# Patient Record
Sex: Female | Born: 1950 | Race: White | Hispanic: No | Marital: Single | Smoking: Never smoker
Health system: Southern US, Community
[De-identification: ages and names within clinical notes are randomized; demographics above are authoritative.]

## PROBLEM LIST (undated history)

## (undated) DIAGNOSIS — I639 Cerebral infarction, unspecified: Secondary | ICD-10-CM

## (undated) DIAGNOSIS — I10 Essential (primary) hypertension: Secondary | ICD-10-CM

## (undated) DIAGNOSIS — M545 Low back pain, unspecified: Secondary | ICD-10-CM

## (undated) DIAGNOSIS — I739 Peripheral vascular disease, unspecified: Secondary | ICD-10-CM

## (undated) DIAGNOSIS — K219 Gastro-esophageal reflux disease without esophagitis: Secondary | ICD-10-CM

## (undated) DIAGNOSIS — M1990 Unspecified osteoarthritis, unspecified site: Secondary | ICD-10-CM

## (undated) DIAGNOSIS — F419 Anxiety disorder, unspecified: Secondary | ICD-10-CM

## (undated) DIAGNOSIS — Z87442 Personal history of urinary calculi: Secondary | ICD-10-CM

## (undated) DIAGNOSIS — R112 Nausea with vomiting, unspecified: Secondary | ICD-10-CM

## (undated) DIAGNOSIS — J189 Pneumonia, unspecified organism: Secondary | ICD-10-CM

## (undated) DIAGNOSIS — Z9889 Other specified postprocedural states: Secondary | ICD-10-CM

## (undated) DIAGNOSIS — R51 Headache: Secondary | ICD-10-CM

## (undated) DIAGNOSIS — F32A Depression, unspecified: Secondary | ICD-10-CM

## (undated) DIAGNOSIS — F329 Major depressive disorder, single episode, unspecified: Secondary | ICD-10-CM

## (undated) DIAGNOSIS — R519 Headache, unspecified: Secondary | ICD-10-CM

## (undated) HISTORY — PX: BREAST SURGERY: SHX581

## (undated) HISTORY — PX: COLONOSCOPY: SHX174

## (undated) HISTORY — PX: ABDOMINAL HYSTERECTOMY: SHX81

## (undated) HISTORY — PX: OTHER SURGICAL HISTORY: SHX169

## (undated) HISTORY — PX: BACK SURGERY: SHX140

---

## 1997-07-09 DIAGNOSIS — I639 Cerebral infarction, unspecified: Secondary | ICD-10-CM

## 1997-07-09 HISTORY — DX: Cerebral infarction, unspecified: I63.9

## 1997-07-09 HISTORY — PX: SHOULDER ARTHROSCOPY: SHX128

## 2001-07-09 HISTORY — PX: SHOULDER ARTHROSCOPY: SHX128

## 2007-08-01 ENCOUNTER — Observation Stay (HOSPITAL_COMMUNITY): Admission: RE | Admit: 2007-08-01 | Discharge: 2007-08-02 | Payer: Self-pay | Admitting: Neurosurgery

## 2007-09-04 ENCOUNTER — Encounter: Admission: RE | Admit: 2007-09-04 | Discharge: 2007-09-04 | Payer: Self-pay | Admitting: Neurosurgery

## 2007-12-09 ENCOUNTER — Encounter: Admission: RE | Admit: 2007-12-09 | Discharge: 2007-12-09 | Payer: Self-pay | Admitting: Neurosurgery

## 2008-01-02 ENCOUNTER — Encounter: Admission: RE | Admit: 2008-01-02 | Discharge: 2008-01-02 | Payer: Self-pay | Admitting: Neurosurgery

## 2008-01-27 ENCOUNTER — Encounter: Admission: RE | Admit: 2008-01-27 | Discharge: 2008-01-27 | Payer: Self-pay | Admitting: Neurosurgery

## 2008-03-05 ENCOUNTER — Ambulatory Visit: Payer: Self-pay | Admitting: Pulmonary Disease

## 2008-03-05 ENCOUNTER — Inpatient Hospital Stay (HOSPITAL_COMMUNITY): Admission: RE | Admit: 2008-03-05 | Discharge: 2008-03-12 | Payer: Self-pay | Admitting: Neurosurgery

## 2008-05-11 ENCOUNTER — Encounter: Admission: RE | Admit: 2008-05-11 | Discharge: 2008-05-11 | Payer: Self-pay | Admitting: Neurosurgery

## 2008-05-18 ENCOUNTER — Encounter: Admission: RE | Admit: 2008-05-18 | Discharge: 2008-05-18 | Payer: Self-pay | Admitting: Neurosurgery

## 2008-11-30 ENCOUNTER — Encounter: Admission: RE | Admit: 2008-11-30 | Discharge: 2008-11-30 | Payer: Self-pay | Admitting: Neurosurgery

## 2009-01-21 ENCOUNTER — Encounter: Admission: RE | Admit: 2009-01-21 | Discharge: 2009-01-21 | Payer: Self-pay | Admitting: Neurosurgery

## 2009-07-25 IMAGING — CR DG CHEST 1V PORT
1 series · 1 of 1 positions shown · non-contrast
Comparison: 03/07/2008.

CLINICAL DATA: Post lumbar fusion.  Follow-up atelectasis.

PORTABLE CHEST - 1 VIEW

[view not recorded]
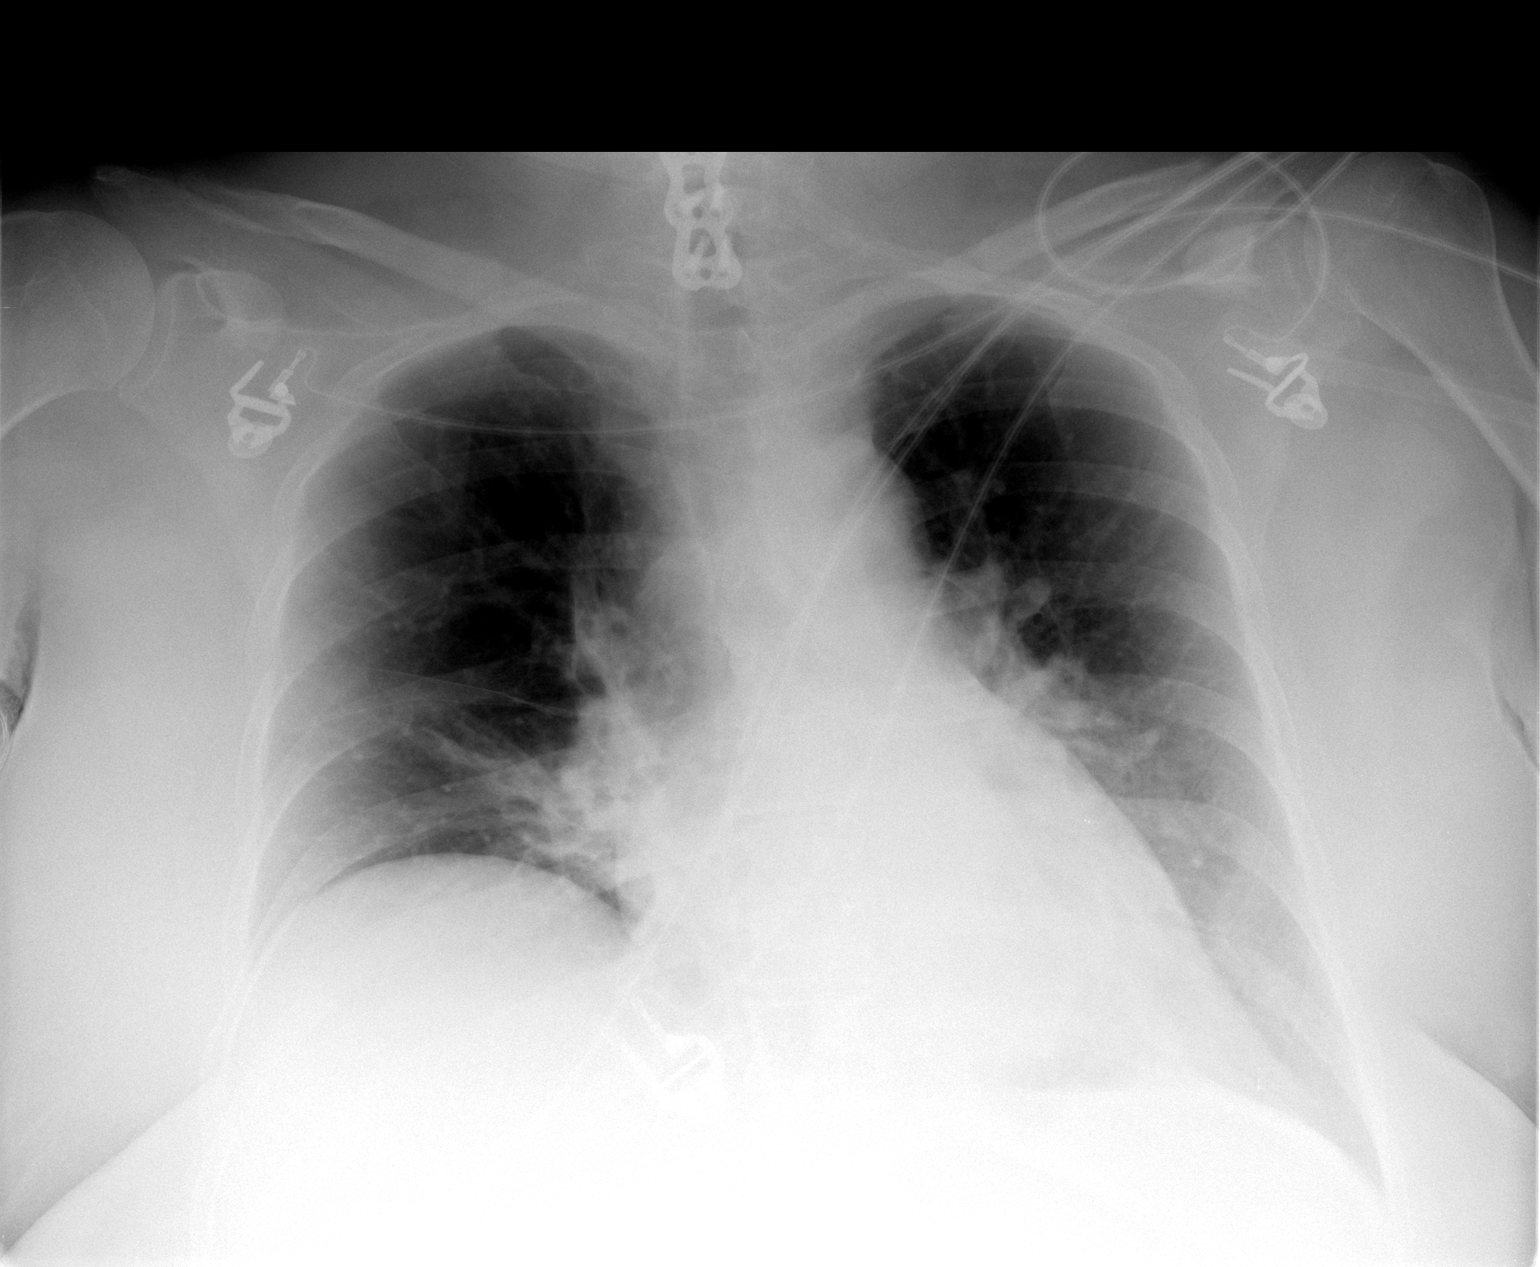

[1 of 1 positions shown; findings below may reference images not displayed]

FINDINGS: There are stable right basilar atelectatic changes.
There is also mild left basilar atelectasis present.  There is no
pneumothorax.  The heart is borderline in size and stable in
appearance.  There are no edematous changes.  No evidence for
pneumothorax.
IMPRESSION: Stable right basilar atelectasis.  Mild left basilar atelectasis
noted on today's study.

## 2009-09-26 ENCOUNTER — Encounter: Admission: RE | Admit: 2009-09-26 | Discharge: 2009-09-26 | Payer: Self-pay | Admitting: Neurosurgery

## 2009-11-24 ENCOUNTER — Encounter: Admission: RE | Admit: 2009-11-24 | Discharge: 2009-11-24 | Payer: Self-pay | Admitting: Neurosurgery

## 2009-12-26 ENCOUNTER — Ambulatory Visit (HOSPITAL_COMMUNITY): Admission: RE | Admit: 2009-12-26 | Discharge: 2009-12-27 | Payer: Self-pay | Admitting: Neurosurgery

## 2010-05-22 ENCOUNTER — Encounter: Admission: RE | Admit: 2010-05-22 | Discharge: 2010-05-22 | Payer: Self-pay | Admitting: Sports Medicine

## 2010-07-30 ENCOUNTER — Encounter: Payer: Self-pay | Admitting: Neurosurgery

## 2010-07-31 ENCOUNTER — Encounter: Payer: Self-pay | Admitting: Orthopedic Surgery

## 2010-09-24 LAB — CBC
Hemoglobin: 13.7 g/dL (ref 12.0–15.0)
MCHC: 33.7 g/dL (ref 30.0–36.0)
MCV: 89.2 fL (ref 78.0–100.0)
RBC: 4.55 MIL/uL (ref 3.87–5.11)
RDW: 13.9 % (ref 11.5–15.5)

## 2010-09-24 LAB — BASIC METABOLIC PANEL
CO2: 27 mEq/L (ref 19–32)
Chloride: 108 mEq/L (ref 96–112)
Creatinine, Ser: 0.93 mg/dL (ref 0.4–1.2)
GFR calc Af Amer: >60 mL/min (ref >60)
Glucose, Bld: 97 mg/dL (ref 70–99)
Sodium: 141 mEq/L (ref 135–145)

## 2010-10-25 ENCOUNTER — Other Ambulatory Visit (HOSPITAL_COMMUNITY): Payer: Self-pay

## 2010-10-26 ENCOUNTER — Encounter (HOSPITAL_COMMUNITY)
Admission: RE | Admit: 2010-10-26 | Discharge: 2010-10-26 | Disposition: A | Discharge status: Home / Self Care | Payer: Medicare Other | Source: Ambulatory Visit | Attending: Neurosurgery | Admitting: Neurosurgery

## 2010-10-26 LAB — TYPE AND SCREEN: ABO/RH(D): B POS

## 2010-10-26 LAB — BASIC METABOLIC PANEL
BUN: 21 mg/dL (ref 6–23)
CO2: 24 mEq/L (ref 19–32)
Calcium: 9.1 mg/dL (ref 8.4–10.5)
Creatinine, Ser: 1.17 mg/dL (ref 0.4–1.2)
GFR calc non Af Amer: 48 mL/min — ABNORMAL LOW (ref >60)
Glucose, Bld: 100 mg/dL — ABNORMAL HIGH (ref 70–99)

## 2010-10-26 LAB — CBC
HCT: 42.4 % (ref 36.0–46.0)
Hemoglobin: 14.2 g/dL (ref 12.0–15.0)
MCH: 29.3 pg (ref 26.0–34.0)
MCHC: 33.5 g/dL (ref 30.0–36.0)
RDW: 13.8 % (ref 11.5–15.5)

## 2010-11-01 ENCOUNTER — Inpatient Hospital Stay (HOSPITAL_COMMUNITY): Payer: Medicare Other

## 2010-11-01 ENCOUNTER — Inpatient Hospital Stay (HOSPITAL_COMMUNITY)
Admission: RE | Admit: 2010-11-01 | Discharge: 2010-11-04 | DRG: 460 | Disposition: A | Discharge status: Home / Self Care | Payer: Medicare Other | Source: Ambulatory Visit | Attending: Neurosurgery | Admitting: Neurosurgery

## 2010-11-01 DIAGNOSIS — M5126 Other intervertebral disc displacement, lumbar region: Secondary | ICD-10-CM | POA: Diagnosis present

## 2010-11-01 DIAGNOSIS — Z01812 Encounter for preprocedural laboratory examination: Secondary | ICD-10-CM

## 2010-11-01 DIAGNOSIS — M5137 Other intervertebral disc degeneration, lumbosacral region: Principal | ICD-10-CM | POA: Diagnosis present

## 2010-11-01 DIAGNOSIS — M51379 Other intervertebral disc degeneration, lumbosacral region without mention of lumbar back pain or lower extremity pain: Principal | ICD-10-CM | POA: Diagnosis present

## 2010-11-03 LAB — BASIC METABOLIC PANEL
CO2: 31 mEq/L (ref 19–32)
Calcium: 7.7 mg/dL — ABNORMAL LOW (ref 8.4–10.5)
Chloride: 100 mEq/L (ref 96–112)
GFR calc Af Amer: 54 mL/min — ABNORMAL LOW (ref >60)
Potassium: 4.3 mEq/L (ref 3.5–5.1)
Sodium: 135 mEq/L (ref 135–145)

## 2010-11-05 NOTE — Discharge Summary (Signed)
  NAMEWILNA, Sierra Leach                 ACCOUNT NO.:  [id number]  MEDICAL RECORD NO.:  [id number]           PATIENT TYPE:  I  LOCATION:  3011                         FACILITY:  [facility]  PHYSICIAN:  Cristi Loron, M.D.DATE OF BIRTH:  [birth year]  DATE OF ADMISSION:  11/01/2010 DATE OF DISCHARGE:  11/04/2010                              DISCHARGE SUMMARY   BRIEF HISTORY:  The patient is a 60 year old female who has longstanding back pain and underwent an L3, L5-S1 fusion many years ago, underwent hardware removal 2 years ago, and has had progressive worsening back and bilateral leg pain over less than 2-3 months.  Radiating down to the front of her quad and inner part of her thigh.  Followup imaging showed progressive degenerative breakdown at both L1-2 and L2-3 with severe spinal stenosis.  Recess stenosis at L2-3 consistent with her L2 and L3 radiculopathy.  She had a previous fusion and due to failing conservative management, we recommended PLIF at L2-3 with posterolateral fusion at L1-3.  I went over to risks and benefits of the operation with her and she understands and agreed to proceed forward.  For further details of this admission, please refer to typed history and physical.  HOSPITAL COURSE:  Dr. Wynetta Emery admitted the patient to [facility] on November 01, 2010.  On the day of admission, we performed an L1-L3 decompression and fusion.  Surgery went well (for full details of this operation, please refer to typed operative note).  POSTOPERATIVE COURSE:  The patient's postop course was unremarkable. She was discharged home on postop #3.  DISCHARGE INSTRUCTIONS:  The patient was given written discharge instructions.  Instructed to follow up with Dr. Wynetta Emery.  FINAL DIAGNOSES:  Degenerative disease at L1-2 and L2-3 with a ruptured disk L2-3, with severe lumbar spinal stenosis.  PROCEDURE PERFORMED:  Lumbar decompressive laminectomy and posterior lumbar interbody fusion at  L2-3 with a hybrid Telamon PEEK cage and tangent allograft wedge with locally harvested autograft bone, mixed with Actifuse and BMP, and pedicle screw fixation at L1-L3 using globus Revere pedicle screw system, posterolateral arthrodesis L1-L3 with local harvested autograft bone and Actifuse and BMP and placement of large Hemovac drain.     Cristi Loron, M.D.    JDJ/MEDQ  D:  11/04/2010  T:  11/04/2010  Job:  [id number]  Electronically Signed by Tressie Stalker M.D. on 11/05/2010 11:08:46 AM

## 2010-11-09 NOTE — Op Note (Signed)
Sierra Leach, Sierra Leach                 ACCOUNT NO.:  [id number]  MEDICAL RECORD NO.:  [id number]           PATIENT TYPE:  I  LOCATION:  3011                         FACILITY:  [facility]  PHYSICIAN:  Donalee Citrin, M.D.        DATE OF BIRTH:  1951-05-15  DATE OF PROCEDURE:  11/01/2010 DATE OF DISCHARGE:                              OPERATIVE REPORT   PREOPERATIVE DIAGNOSES:  Degenerative disease L1-2 and L2-3 with ruptured disk L2-3 and severe lumbar spinal stenosis.  PROCEDURE:  Decompressive lumbar laminectomy and posterior lumbar interbody fusion L2-3 using a hybrid Telamon PEEK cage and TANGENT allograft wedge with locally harvested autograft mixed with Actifuse and BMP and pedicle screw fixation L1-L3 using the Globus REVERE pedicle screw system, posterolateral arthrodesis L1-L3 using locally harvested autograft mixed with Actifuse and BMP, and placement of large Hemovac drain.  SURGEON:  Donalee Citrin, M.D.  Threasa HeadsYetta Barre.  ANESTHESIA:  General endotracheal.  HISTORY OF PRESENT ILLNESS:  The patient is a 60 year old female who has had longstanding back pain, underwent L3-S1 fusion many years ago, underwent hardware removal 2 years ago and has had progressive worsening back and bilateral leg pain over the last 2-3 months, radiating down to front of her quad and inner part of her thigh.  Followup imaging showed progressive degeneration breakdown at both L1-2 and L2-3 with severe spinal stenosis, lateral recess stenosis at L2-3 consistent with her L2 and L3 radiculopathy.  This was above a previous fusion and due to the failing conservative treatment, we recommended a PLIF at L2-3 with posterolateral fusion L1-L3.  I went over the risks and benefits of the operation with her, she understood and agreed to proceed forward.  DESCRIPTION OF PROCEDURE:  The patient was brought to the OR, was induced under general anesthesia, and placed prone on the Wilson frame. Back was prepped and  draped in usual sterile fashion.  Old incision was partially open at the superior aspect and extended superiorly. Subperiosteal dissection carried through the scar exposing the T-piece at L1, L2, and L3 respectively.  Intraoperative x-ray identify the appropriate level, so the spinous process at L2 was removed.  Complete central decompression was begun and complete medial facetectomies were performed with dissecting through the scar tissue and dissecting out the L3 nerve free of the pedicle at L3.  Aggressive foraminotomies were performed at L2 and after adequate central decompression has been achieved, which was remarkable for marked facet arthropathy and severe hourglass compression of thecal sac, attention taken to pedicle screw placement.  Pilot holes were drilled at L1 and L2, cannulated with the awl probe, tapped with a 5/5 tap, probed again and six 5/40 screws inserted at L1, six 5/45 at L2 and the old pedicle holes at L3 were also identified and six 5/45 were inserted there.  After all six screws were placed, attention was taken to interbody work.  Disk spaces were cleaned out bilaterally.  First an 8 distractor was inserted but this felt to be too small, so a 10 distractor subsequently placed on the other side. Disk space was then cleaned out with a  size 10 rotating cutter and downgoing curettes.  Then, a, Telamon PEEK cage packed with locally harvested autograft mixed with Actifuse and placed on the patient's right side.  Left side distractor was removed.  Fluoroscopy was used up along the way to confirm depth and trajectory.  Disk space was cleaned out in similar fashion on the left.  BMP was packed anteriorly, autograft was packed centrally and the left side TANGENT was inserted. All the foramina were reinspected and confirmed patency.  Then, wound was copiously irrigated.  Meticulous hemostasis was maintained. Aggressive decortication was carried at T-piece and lateral  gutters. BMP was packed posterolaterally with autograft on top of it and then 65- mm rod was placed on one side, 75 on the other.  Top-tightening nuts down the L2 screws compressed against L3 and the L1 tightened down as well.  After all pedicle screws had been tightened down, fluoroscopy and cross-link were applied.  Postop fluoroscopy confirmed good position of everything.  The drain was placed and the wound was closed in layers with Vicryl and running 4-0 subcuticular.  Benzoin and Steri-Strips were applied.  The patient went to the recovery room in stable condition.  At the end of the case, sponge, needle, and instrument counts were correct.          ______________________________ Donalee Citrin, M.D.     GC/MEDQ  D:  11/01/2010  T:  11/02/2010  Job:  [id number]  Electronically Signed by Donalee Citrin M.D. on 11/09/2010 11:19:02 AM

## 2010-11-21 NOTE — Op Note (Signed)
NAMETELISA, OHLSEN NO.:  [id number]   MEDICAL RECORD NO.:  [id number]          PATIENT TYPE:  INP   LOCATION:  3105                         FACILITY:  [facility]   PHYSICIAN:  Donalee Citrin, M.D.        DATE OF BIRTH:  06/11/1951   DATE OF PROCEDURE:  03/05/2008  DATE OF DISCHARGE:                               OPERATIVE REPORT   PREOPERATIVE DIAGNOSES:  1. Degenerative disk disease with ruptured disk, L3-4.  2. Degenerative disk disease with spondylosis, L4-5.  3. Degenerative disk disease with ruptured disk, L5-S1, with      diskogenic mechanical low back pain and lumbar spinal stenosis.  4. 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   PROCEDURES:  1. Redo decompressive lumbar laminectomy, L5-S1.  2. Redo decompressive lumbar laminectomy, L4-5.  3. Decompressive lumbar laminectomy, L3-4.  4. Posterior lumbar interbody fusion, L3-4, L4-5, using a hybrid      Telamon on one side with Tangent on the other.  5. Transforaminal lumbar interbody fusion, L5-S1, using the Capstone      PEEK system packed with local autograft mixed with Actifuse.  6. Pedicle screw fixation, L3 to S1, using 6.35 Legacy pedicle screw      system.  7. Posterolateral arthrodesis, L3 to S1, using local autograft mixed      with Actifuse bone substitute.  8. Placement of 2 medium Hemovac drains.   SURGEON:  Donalee Citrin, MD   ASSISTANT:  Reinaldo Meeker, MD   ANESTHESIA:  General endotracheal.   HISTORY OF PRESENT ILLNESS:  The patient is a very pleasant 60 year old  female who has had longstanding back pain, had 2 previous laminectomies,  gotten progressively worse, refractory to all forms of conservative  treatment.  Pain was predominantly in her back but secondarily in her  legs in both the left leg down the bottom of foot as well as in the  right leg upper thigh.  Her MRI scan showed a disk herniation at L3-4 as  well as a recurrent disk herniation at L5-S1 and severe  degenerative  disk disease and scar tissue at L3-4, L4-5, and L5-S1.  So, the patient  after failing all forms of conservative treatment was recommended redo  decompressive laminectomies and fusion from L3 to S1.  Risk and benefits  of the operation were explained to the patient.  She understood and  agreed to proceed forward.   DESCRIPTION OF PROCEDURE:  The patient was brought to the OR, was  induced under general anesthesia, and placed prone on the Wilson frame.  Back was prepped in usual sterile fashion.  Her old incision was opened  up and extended cephalocaudally.  The scar tissue was dissected free,  and subperiosteal dissection was carried out at the lamina of L3, L4,  L5, and S1.  The scar tissue was extensively dissected off the residual  laminotomies from the old surgery at L5-S1 on the left and L4-5 on the  right.  All TPs were exposed.  Intraoperative x-ray confirmed  localization at the appropriate level, and the spinous processes at L3,  L4, and L5 were removed.  Complete central decompression was performed,  and then marching along the lateral gutters complete medial  facetectomies were performed working around the scar tissue, first  working at L5-S1.  There was an extensive amount of scar on the left  side L5-S1 and a very large recurrent calcified disk.  As the dura was  being teased off the calcified disk, a small annular rent was  appreciated with a small amount of spinal fluid coming out of this; this  was packed away.  The large calcified recurrent disk was bitten away  with combination of osteophyte removers and pituitary rongeurs.  After  these adequate foraminotomies had been performed, I was able to work  around this calcified disk at L5 and S1, the right side.  Complete  medial facetectomies were performed, and that disk was markedly  degenerated and collapsed.  Then marching up to L4-5, there was a severe  amount of scar tissue around the right side causing  severe stenosis in  the proximal L5 root.  This was all freed up and removed.  Extensive  facet arthropathy at both these levels also causing hourglass  compression of the thecal sac here too.  This was all teased away and  removed with complete medial facetectomies performed there as well,  decompressing both the L4 and L5 root.  Then, working up at L3-4,  complete medial facetectomies were performed.  There was marked facet  arthropathy displacing the L3 root superiorly as well as forward  inferiorly.  This was all cleaned away and removed.  After the  decompression had been completed, attention was taken to pedicle screw  placement.  Using a high-speed drill, pilot holes were drilled at L3 on  the right and cannulated with the awl.  Using bony landmarks both within  the canal and external to the canal and using fluoroscopy, this was  cannulated, probed, tapped with a 5.5 tap, probed again, and 6 x 50  screw inserted at L3 on the right.  This procedure was repeated at L4  with 6 x 45, at L5 with 6 x 40, and S1 with a 6 x 35.  On the left side,  all 4 screws were placed in similar fashion.  Fluoroscopy at each step  along the way confirmed good depth and trajectory, and the pedicles were  probed from within the pedicle as well as using bony landmarks from  within the canal and external to the canal confirming no mediolateral  breach.  After all 8 screws were in place, attention was taken to the  interbody.  First working at L3-4, the epidural veins were coagulated  and there was a very marked amount of bleeding during the case,  predominantly from the epidural veins but also from the bone.  This was  waxed away as best we could.  After the disk space was cleaned out with  pituitary rongeurs, a size 10 distractor was inserted.  This had good  apposition of the endplates and felt to be appropriate sizing for the  interbody spacers, so working on the opposite side disk space was  cleaned  out with a size 10 cutter and chisel.  Then, a 10 x 22 mm  Telamon PEEK cage packed with local harvested autograft mixed with  Actifuse bone substitute was placed.  Then, the distractor was removed.  The opposite side was cleaned out and reamed.  Central endplates were  scraped.  Autograft was packed centrally, and  the opposite-side Tangent  was inserted.  After L3-4 was complete, attention was taken to L4-5.  The scar tissue again was dissected off  the laminotomy defect on the  right at this side freeing up the disk space as well as removing a large  herniation from underneath the L4 root at the inferior aspect of L3-4  disk space as well as at the level of L4-5 disk space.  After all this  had been performed, an 8 distractor was inserted.  This had good  apposition of the endplates and felt to be appropriate size.  So, in a  similar fashion endplates were prepped on both sides, using fluoroscopy  each step along the way to confirm depth and trajectory, scraped and  chiseled.  Telamon 8 x 22 packed with local autograft mixed with  Actifuse was inserted on one side with a Tangent on the other with  autograft packed between.  Then, again working at L5-S1, because of the  extensive calcified recurrent disk herniation, it was very difficult to  gain access to the disk space on the left side L5-S1, so a distractor  was inserted on the right.  Then, working through this calcified disk,  the thecal sac was decompressed.  The S1 and L5 nerve roots were  decompressed.  Attempts were made to ream this out.  However, the defect  in the disk space looked like it violated the endplates at L5, so  instead of placing a graft it was decided to convert this level over to  a TLIF, and a TLIF was performed using the Capstone PEEK system, so the  right-sided distractor was removed.  This was prepped for TLIF, and the  TLIF bone was packed with local autograft mixed with Actifuse and  inserted from the  patient's right side across the midline.  Fluoroscopy  confirmed good depth and trajectory.  This was inspected from both sides  and felt to be in good position.  Then, after all this was performed,  aggressive irrigation and aggressive decortication with T-piece and  lateral gutters were performed.  Remainder of the autograft was packed  in the lateral gutters.  Rods were inserted.  Palpation was done  at S1;  the L5 pedicle screw was compressed against S1 much more dramatically on  the right side than the left, where there was defect, and then on the L4-  5 it was compressed and L3-4 it was compressed.  A 420 cross-link was  then inserted.  After all of this had been placed, the foramina were  reinspected, and all the nerve roots seemed to be widely decompressed.  Gelfoam and Surgifoam were used to maintain hemostasis.  Two drains were  then placed, and after fluoroscopy confirmed good position of the  screws, rods, and bone grafts the wound was closed in layers with  interrupted Vicryl over the drains, and prior to that Tisseel  was placed over that dural defect and then packed with Gelfoam.  The  skin was closed with running 4-0 subcuticular.  Benzoin and Steri-Strips  were applied.  The patient went to recovery room in stable condition.  At the end of the case, sponge, needle, and instrument counts were  correct.           ______________________________  Donalee Citrin, M.D.     GC/MEDQ  D:  03/05/2008  T:  03/06/2008  Job:  [id number]

## 2010-11-21 NOTE — Op Note (Signed)
NAMENICKOLA, LENIG                 ACCOUNT NO.:  [id number]   MEDICAL RECORD NO.:  [id number]          PATIENT TYPE:  INP   LOCATION:  3172                         FACILITY:  [facility]   PHYSICIAN:  Donalee Citrin, M.D.        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   PROCEDURE:  Anterior cervical diskectomies and fusion at C4-5, C5-6 and  C6-7 using 7 mm allografts at C4-5 and C6-7, a 6 mm at C5-6, and a 60 mm  Venture plate with eight 32 mm screws.   SURGEON:  Donalee Citrin, MD.   Threasa HeadsYetta Barre.   ANESTHESIA:  General endotracheal.   HPI:  The patient is a very pleasant 60 year old female who has had  progressively worsening neck and bilateral arm pain ringing down her  left arm into her left hand as well as into the right shoulder with  numbness in her right hand.  The patient had weakness in the triceps  preoperatively and patient failed local and conservative treatment.  An  MRI scan showed severe spondylosis with stenosis, foraminal stenosis C4-  5, C5-6 and C6-7.  The patient was recommended cervical diskectomy and  fusion.  The risks and benefits of the operation were explained to the  patient, she understood and agreed.   PROCEDURE REPORT:  The patient was brought to the OR, was induced under  general anesthesia, placed supine with the neck in slight extension  __________ traction.  The right side of the neck was prepped and draped  in the usual sterile fashion.  Preop anesthesia localized the C5-6 disk  space.  A curvilinear incision was made __________ and divided  longitudinally.  The avascular plane __________ strap muscles was  developed down into the prevertebral fascia.  The prevertebral fascia  __________ .  The C4-5 disk space was  identified by fluoroscopy  __________  disk space__________ was reflected laterally at that level  and the two levels below.  A large osteophyte and spur was bitten off  the C5-6 disk space and then both interspaces were entered.  With  __________ Kerrison punch large anterior osteophytes were bitten off the  C4, C5 and C6 vertebral bodies respectively.  Then __________ curets  were used to scrape the disk and endplates and then high speed was  drilled on all three disk spaces at the posterior __________ osteophytic  complexes.  At this point __________ first at C6-7.  The interspace was  drilled down further to the posterior annulus, posterior osteophyte and  using a 1 mm Kerrison punch the osteophytes coming off of the C6 and C7  vertebral bodies were under bitten, decompressing the central canal.  __________ posterior longitudinal ligament in piecemeal fashion.  Marching across the patient's left side, a fairly sizeable bone spur  coming off the C6 vertebral body was removed, decompressing the central  canal and the C7 nerve root on the left was identified and decompressed  flush to the pedicle.  Then marching up to the right a larger spur was  coming off the uncinate process, this was all removed, decompressing the  right C7 nerve root.  Then Gelfoam was __________ space.  Attention was  taken to C5-6.  This disk was noted to be __________ degenerated, it was  drilled down.  A very large osteophyte came off the C5 vertebral body,  it was under bitten, this was mostly leftward and removal of this  decompressed the central canal and both C6 nerve roots identified  __________ pedicle.  Aggressive under biting of both endplates were  achieved at all levels and this decompressed the central canal as well.  Then working at C4-5 in a similar fashion the disk space was drilled  down, decompressing under bitten both C4 and C5 vertebral bodies,  flushed with the C5 pedicle.  The right C5 nerve  root was remarkably  stenotic.  This was all decompressed and easily accepted a nerve hook at  the end of the foraminotomy.  Once adequate decompression had been  achieved at all levels and all foramina, 7 mm grafts were inserted 1 mm  deep to anterior vertebral body __________ both the C4-5 and C6-7 as  well as C5-6.  Then a 60 mm Venture plate was placed.  Meticulous  hemostasis was maintained with Surgifoam and irrigation and  electrocautery.  All 8 screws were inserted and all screws had excellent  purchase and locking mechanism was engaged.  Fluoroscopy confirmed good  position of the plates, screws and bone grafts and then the wound was  copiously irrigated again with continuous hemostasis maintained,  __________ was reapproximated with interrupted Vicryl and the skin was  closed with a running #4-0 subcuticular __________ went to the recovery  room in stable condition.           ______________________________  Donalee Citrin, M.D.     GC/MEDQ  D:  08/01/2007  T:  08/01/2007  Job:  [id number]

## 2010-11-21 NOTE — Consult Note (Signed)
Sierra Leach, FLOOR NO.:  [id number]   MEDICAL RECORD NO.:  [id number]          PATIENT TYPE:  INP   LOCATION:  3105                         FACILITY:  [facility]   PHYSICIAN:  Maree Krabbe, M.D.DATE OF BIRTH:  November 04, 1950   DATE OF CONSULTATION:  03/07/2008  DATE OF DISCHARGE:                                 CONSULTATION   REASON FOR CONSULT:  Acute renal insufficiency and elevated creatinine.   HISTORY:  This is a 57-year white female with a history of obesity,  hypertension, DJD with a history of cervical spine surgery in the past,  as well as chronic low back pain, who was admitted on March 05, 2008,  for elective lumbar spine surgery.  She had extensive surgery with  significant blood loss and postoperative hypotension with blood  pressures in the 80s-90s.  She had a creatinine of 1.27 on admission  which has been up in the mid 2s over the last 36 hours and her urine  output has been down around 30 mL an hour yesterday.  Blood pressure is  lower today.  After some IV fluid boluses and increased IV fluid rates,  the blood pressure is now up in the 110s and urine output has improved  up to 70 mL an hour.  The patient has no complaints.  She is fully  alert.  She denies any history of kidney disease.  She does have a prior  history of kidney stones.  She denies any shortness of breath, chest  pain, nausea, vomiting currently, abdominal pain, or diarrhea.  She has  a Foley catheter in place.  She denies any myalgia, arthralgia, or focal  numbness or weakness.   PAST MEDICAL HISTORY:  As above.   MEDICATIONS AT HOME:  She takes Coreg, Celexa, and a pain pill.  She  also said that she takes a generic of Diovan, but this is not on her med  list on her H&P.   CURRENT MEDICATIONS:  In the hospital are IV vancomycin, Colace, and  p.r.n.'s.   PAST SURGICAL HISTORY:  Hysterectomy and cervical spine surgery.   REVIEW OF SYSTEMS:  As above.   PHYSICAL  EXAMINATION:  VITAL SIGNS:  Blood pressure is 116/70, heart  rate 80, and respirations are 16.  GENERAL:  The patient is alert and oriented white female, significantly  obese.  No distress.  SKIN:  Without rash.  She does have 1+ nonpitting diffuse edema in arms  and legs.  She has some telangiectases about on her face and chin.  CHEST:  Clear throughout.  CARDIAC:  Regular rate and rhythm without rub or gallops.  She does have  2/6 systolic ejection murmur which is transmitted to both carotids.  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  ABDOMEN:  Obese and nontender.  Suprapubic scar well-healed from  previous hysterectomy.  EXTREMITIES:  No femoral bruits.  No cyanosis.  Good pulses in the feet.  NEUROLOGICAL:  Alert and oriented x3.   LABORATORY DATA:  Sodium 138, potassium 4.2, BUN 27, creatinine 2.4,  hemoglobin 8.8, magnesium 1.4, and calcium 6.8.   IMPRESSION:  1. Acute renal failure due to postoperative hypotension and subsequent      renal hypoperfusion.  Currently, blood pressure has improved      significantly and urine output is improving also with restoration      of blood pressure.  I expect that she will start to recover      function and I would keep her blood pressure over 110 systolic if      possible, in the short term.  Also recommend transfusing 1 more      unit of packed red blood cells for colloid support.  2. Status post lumbar surgery in March 05, 2008.  3. Anemia due to acute blood loss.  4. History of previous C-spine surgery.  5. History of hypertension, on Coreg and also possibly generic Diovan      at home.  6. Depression, on Celexa.   PLAN:  As above.      Maree Krabbe, M.D.  Electronically Signed     RDS/MEDQ  D:  03/07/2008  T:  03/08/2008  Job:  [id number]

## 2010-11-24 NOTE — Discharge Summary (Signed)
NAMESHARADA, ALBORNOZ NO.:  [id number]   MEDICAL RECORD NO.:  [id number]          PATIENT TYPE:  INP   LOCATION:  3011                         FACILITY:  [facility]   PHYSICIAN:  Donalee Citrin, M.D.        DATE OF BIRTH:  24-Aug-1950   DATE OF ADMISSION:  03/05/2008  DATE OF DISCHARGE:  03/12/2008                               DISCHARGE SUMMARY   ADMITTING DIAGNOSES:  1. Degenerative disk disease.  2. Lumbar spinal stenosis of L3-4, L4-5, L5-S1.   PROCEDURE:  On this admission was:  Decompressive lumbar laminectomies  and posterior lumbar interbody fusion, L3-4, L4-5, L5-S1.   HOSPITAL COURSE:  The patient is a 60 year old female who was admitted  to the [facility] and went to the operating room, underwent the aforementioned  procedure.  Postoperatively, the patient did very well, the patient went  to the recovery room and then to the ICU.  Postoperatively in the ICU,  the patient was noted to be hypotensive with low urine output.  Laboratory values showed a bump in her creatinine, so the patient was  put to the ICU.  Critical Care Medicine was consulted for managing her  oliguric renal failure.  The patient was watched very carefully in the  ICU due to persistent hypertension secondary to probable hypervolemia.  Creatinine was 3.4 on postoperative day #1.  Postoperative hemoglobin  was 9.9 with a hematocrit of 29.5.  The patient neurologically was doing  well.  She had good strength in her extremities with no focal motor  deficits.  Hemovac was maintained in the first couple of postoperative  days with output exceeding 30 mL in shift.  Renal failure was felt to be  secondary to ATN due to __________ hypertension in the perioperative  period.  This was maintained with persistent fluid and increase in  fluid.  Renal consult was obtained on second postoperative day, and they  recommended additional transfusions, additional increase in her volume  status, changed over some of her  medications as well as ordered renal  ultrasound and urinalysis.  The patient continued to be observed in the  ICU.  The patient continued to make some slow and steady progress and  creatinine slowly started to come down.  Hemoglobin and hematocrit  remained stable and did come up with transfusion.  She was progressively  mobilizing with physical and occupational therapy and subsequently will  be transferred to the floor.  On the floor, the patient continued to  progress and make steady improvement.  Volume status continued to  improve, and her mobilization status continued to improve, and by  hospital day #7, the patient was stable neurologically and from a  Nephrology standpoint, labs were stable and creatinine was slowly coming  down.  The patient was able to be discharged home with a scheduled  followup in approximately 1 week in Neurosurgery as well as follow up  with Nephrology.  The patient was sent home with p.o. pain medication.  She was voiding spontaneously.           ______________________________  Donalee Citrin,  M.D.     GC/MEDQ  D:  04/15/2008  T:  04/16/2008  Job:  [id number]

## 2010-12-07 ENCOUNTER — Ambulatory Visit
Admission: RE | Admit: 2010-12-07 | Discharge: 2010-12-07 | Disposition: A | Discharge status: Home / Self Care | Payer: Medicare Other | Source: Ambulatory Visit | Attending: Neurosurgery | Admitting: Neurosurgery

## 2010-12-07 ENCOUNTER — Other Ambulatory Visit: Payer: Self-pay | Admitting: Neurosurgery

## 2010-12-07 DIAGNOSIS — M545 Low back pain: Secondary | ICD-10-CM

## 2010-12-08 NOTE — Discharge Summary (Signed)
  NAMEMARIELIS, Sierra Leach NO.:  [id number]  MEDICAL RECORD NO.:  [id number]          PATIENT TYPE:  LOCATION:                                 FACILITY:  PHYSICIAN:  Donalee Citrin, M.D.        DATE OF BIRTH:  09-Jul-1951  DATE OF ADMISSION:  12/26/2009 DATE OF DISCHARGE:  12/27/2009                              DISCHARGE SUMMARY   ADMITTING DIAGNOSIS:  Painful hardware.  DISCHARGE DIAGNOSIS:  Painful hardware.  PROCEDURE:  Exploration of fusion, removal of hardware.  HISTORY OF PRESENT ILLNESS:  The patient is a very pleasant 60 year old female who underwent previous 3:1 fusion, initially did very well, however, over the last several weeks to months has had progressive worsening back pain localized around the hardware.  Full workup with CT scan, MRI scan, and plain films have showed fusion and complete exhaustive treatments for managing her pain which had been incomplete, so there is a chance that her overwork may be contributing to her pain, so she was requested and was recommended hardware removal and exploration of fusion.  We went over the risks and benefits of the operation with her and she understands and agreed to proceed forward.  The patient was brought in the hospital and underwent the aforementioned procedure.  Postoperatively, the patient went to the recovery room and then to the floor and on the floor on December 27, 2009, the patient was doing very well postoperatively with significant improvement in her back pain.  Her wound was clean and dry.  Her Hemovac was discontinued and she was ready to be discharged home with scheduled followup in approximately 2 weeks.          ______________________________ Donalee Citrin, M.D.     GC/MEDQ  D:  11/09/2010  T:  11/09/2010  Job:  [id number]  Electronically Signed by Donalee Citrin M.D. on 12/08/2010 06:39:26 AM

## 2011-03-26 ENCOUNTER — Other Ambulatory Visit: Payer: Self-pay | Admitting: Neurosurgery

## 2011-03-26 DIAGNOSIS — M79606 Pain in leg, unspecified: Secondary | ICD-10-CM

## 2011-03-26 DIAGNOSIS — M549 Dorsalgia, unspecified: Secondary | ICD-10-CM

## 2011-03-27 ENCOUNTER — Ambulatory Visit
Admission: RE | Admit: 2011-03-27 | Discharge: 2011-03-27 | Disposition: A | Discharge status: Home / Self Care | Payer: Medicare Other | Source: Ambulatory Visit | Attending: Neurosurgery | Admitting: Neurosurgery

## 2011-03-27 ENCOUNTER — Other Ambulatory Visit: Payer: Self-pay | Admitting: Neurosurgery

## 2011-03-27 VITALS — BP 191/108 | HR 62

## 2011-03-27 DIAGNOSIS — M5416 Radiculopathy, lumbar region: Secondary | ICD-10-CM

## 2011-03-27 DIAGNOSIS — M549 Dorsalgia, unspecified: Secondary | ICD-10-CM

## 2011-03-27 MED ORDER — METHYLPREDNISOLONE ACETATE 40 MG/ML INJ SUSP (RADIOLOG
120.0000 mg | Freq: Once | INTRAMUSCULAR | Status: AC
  Administered 2011-03-27: 120 mg via EPIDURAL

## 2011-03-27 MED ORDER — IOHEXOL 180 MG/ML  SOLN
1.0000 mL | Freq: Once | INTRAMUSCULAR | Status: AC | PRN
  Administered 2011-03-27: 1 mL via EPIDURAL

## 2011-03-29 LAB — BASIC METABOLIC PANEL
Chloride: 104
GFR calc non Af Amer: >60
Glucose, Bld: 101 — ABNORMAL HIGH
Potassium: 4
Sodium: 141

## 2011-03-29 LAB — CBC
HCT: 41.2
Hemoglobin: 13.9
MCV: 89.5
RDW: 12.4

## 2011-04-11 LAB — URINALYSIS, ROUTINE W REFLEX MICROSCOPIC
Bilirubin Urine: NEGATIVE
Glucose, UA: NEGATIVE
Hgb urine dipstick: NEGATIVE
Ketones, ur: NEGATIVE
Protein, ur: NEGATIVE
Urobilinogen, UA: 0.2

## 2011-04-11 LAB — CBC
HCT: 24.7 — ABNORMAL LOW
HCT: 25.4 — ABNORMAL LOW
Hemoglobin: 8.7 — ABNORMAL LOW
MCHC: 33.5
MCHC: 34.1
MCV: 89.7
Platelets: 116 — ABNORMAL LOW
Platelets: 152
RDW: 14.4
WBC: 6

## 2011-04-11 LAB — RENAL FUNCTION PANEL
Albumin: 1.9 — ABNORMAL LOW
Albumin: 1.9 — ABNORMAL LOW
BUN: 18
BUN: 19
CO2: 26
Calcium: 8 — ABNORMAL LOW
Chloride: 107
Chloride: 108
Creatinine, Ser: 1.93 — ABNORMAL HIGH
GFR calc Af Amer: 33 — ABNORMAL LOW
GFR calc non Af Amer: 27 — ABNORMAL LOW
Glucose, Bld: 111 — ABNORMAL HIGH
Glucose, Bld: 115 — ABNORMAL HIGH
Phosphorus: 3
Phosphorus: 3.5
Potassium: 3.2 — ABNORMAL LOW
Potassium: 3.2 — ABNORMAL LOW
Potassium: 4.1
Sodium: 139

## 2011-04-11 LAB — VANCOMYCIN, TROUGH: Vancomycin Tr: 23 — ABNORMAL HIGH

## 2011-04-11 LAB — HEMOGLOBIN AND HEMATOCRIT, BLOOD
HCT: 24.4 — ABNORMAL LOW
Hemoglobin: 8.3 — ABNORMAL LOW

## 2011-04-11 LAB — URINE MICROSCOPIC-ADD ON

## 2011-07-10 DIAGNOSIS — I739 Peripheral vascular disease, unspecified: Secondary | ICD-10-CM

## 2011-07-10 HISTORY — DX: Peripheral vascular disease, unspecified: I73.9

## 2011-08-20 ENCOUNTER — Other Ambulatory Visit: Payer: Self-pay | Admitting: Neurosurgery

## 2011-08-20 DIAGNOSIS — M545 Low back pain: Secondary | ICD-10-CM

## 2011-08-21 ENCOUNTER — Ambulatory Visit
Admission: RE | Admit: 2011-08-21 | Discharge: 2011-08-21 | Disposition: A | Discharge status: Home / Self Care | Payer: Medicare Other | Source: Ambulatory Visit | Attending: Neurosurgery | Admitting: Neurosurgery

## 2011-08-21 ENCOUNTER — Other Ambulatory Visit: Payer: Self-pay | Admitting: Neurosurgery

## 2011-08-21 DIAGNOSIS — M5126 Other intervertebral disc displacement, lumbar region: Secondary | ICD-10-CM

## 2011-08-21 DIAGNOSIS — M545 Low back pain: Secondary | ICD-10-CM

## 2011-12-27 ENCOUNTER — Other Ambulatory Visit (HOSPITAL_COMMUNITY): Payer: Self-pay | Admitting: Neurosurgery

## 2011-12-27 DIAGNOSIS — M48061 Spinal stenosis, lumbar region without neurogenic claudication: Secondary | ICD-10-CM

## 2011-12-27 DIAGNOSIS — M549 Dorsalgia, unspecified: Secondary | ICD-10-CM

## 2012-01-02 ENCOUNTER — Encounter (HOSPITAL_COMMUNITY)
Admission: RE | Admit: 2012-01-02 | Discharge: 2012-01-02 | Disposition: A | Discharge status: Home / Self Care | Payer: Medicare Other | Source: Ambulatory Visit | Attending: Neurosurgery | Admitting: Neurosurgery

## 2012-01-02 ENCOUNTER — Encounter (HOSPITAL_COMMUNITY): Payer: Self-pay

## 2012-01-02 DIAGNOSIS — M48061 Spinal stenosis, lumbar region without neurogenic claudication: Secondary | ICD-10-CM | POA: Insufficient documentation

## 2012-01-02 DIAGNOSIS — M549 Dorsalgia, unspecified: Secondary | ICD-10-CM | POA: Insufficient documentation

## 2012-01-02 HISTORY — DX: Low back pain, unspecified: M54.50

## 2012-01-02 HISTORY — DX: Low back pain: M54.5

## 2012-01-02 MED ORDER — TECHNETIUM TC 99M MEDRONATE IV KIT
23.8000 | PACK | Freq: Once | INTRAVENOUS | Status: AC | PRN
  Administered 2012-01-02: 23.8 via INTRAVENOUS

## 2012-01-25 ENCOUNTER — Other Ambulatory Visit: Payer: Self-pay | Admitting: Sports Medicine

## 2012-01-25 DIAGNOSIS — M545 Low back pain: Secondary | ICD-10-CM

## 2012-01-30 ENCOUNTER — Ambulatory Visit
Admission: RE | Admit: 2012-01-30 | Discharge: 2012-01-30 | Disposition: A | Discharge status: Home / Self Care | Payer: Medicare Other | Source: Ambulatory Visit | Attending: Sports Medicine | Admitting: Sports Medicine

## 2012-01-30 VITALS — BP 176/105 | HR 67

## 2012-01-30 DIAGNOSIS — M5126 Other intervertebral disc displacement, lumbar region: Secondary | ICD-10-CM

## 2012-01-30 DIAGNOSIS — M545 Low back pain: Secondary | ICD-10-CM

## 2012-01-30 MED ORDER — METHYLPREDNISOLONE ACETATE 40 MG/ML INJ SUSP (RADIOLOG
120.0000 mg | Freq: Once | INTRAMUSCULAR | Status: AC
  Administered 2012-01-30: 120 mg via EPIDURAL

## 2012-01-30 MED ORDER — IOHEXOL 180 MG/ML  SOLN
1.0000 mL | Freq: Once | INTRAMUSCULAR | Status: AC | PRN
  Administered 2012-01-30: 1 mL via EPIDURAL

## 2012-03-18 IMAGING — CR DG CHEST 1V PORT
1 series · 1 of 1 positions shown · non-contrast
Comparison: Chest 12/23/2009.

CLINICAL DATA: Line placement.

PORTABLE CHEST - 1 VIEW

[view not recorded]
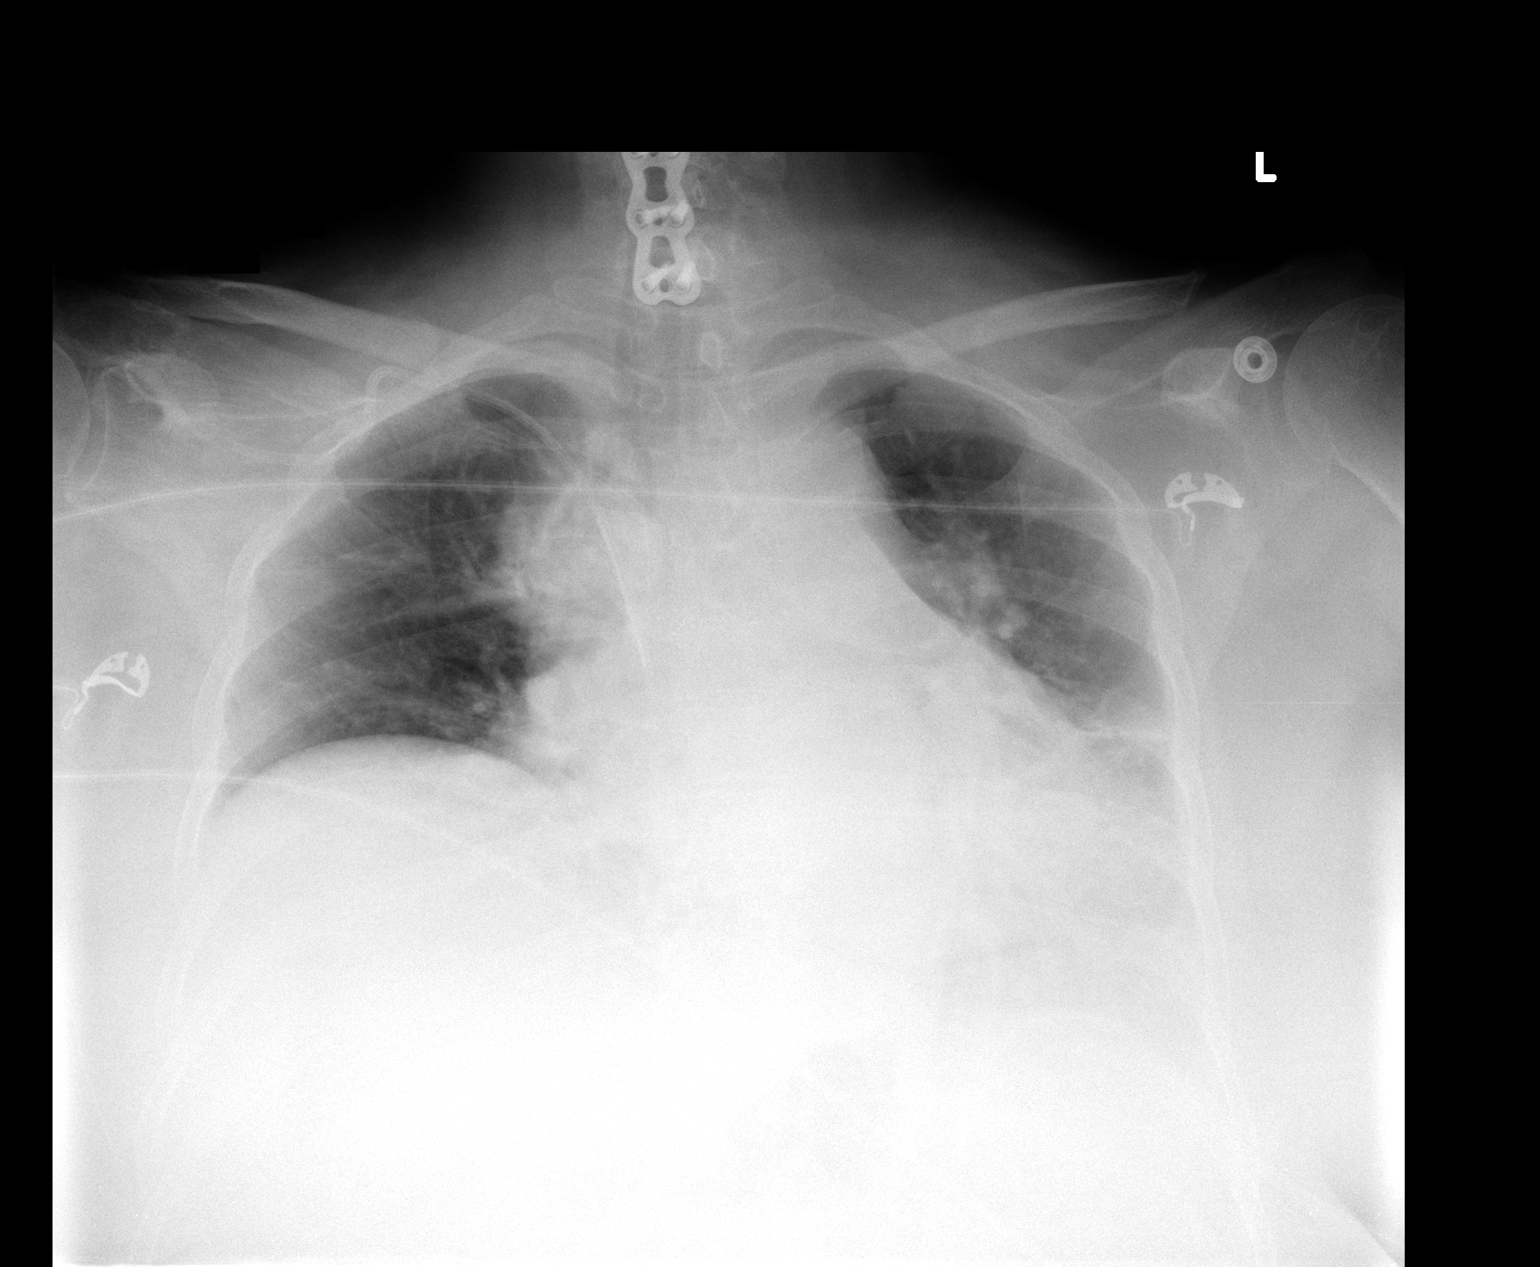

[1 of 1 positions shown; findings below may reference images not displayed]

FINDINGS: Right subclavian catheter is in place with the tip in the
lower superior vena cava.  No pneumothorax.  Lung volumes are low
with some left basilar atelectasis.  Heart size appears normal.
IMPRESSION: Right subclavian catheter in good position.  No pneumothorax or
acute abnormality.

## 2012-03-18 IMAGING — RF DG LUMBAR SPINE 2-3V
1 series · 2 of 2 positions shown · non-contrast
Comparison: CT lumbar spine 11/24/2009

CLINICAL DATA: Lumbar fusion.

LUMBAR SPINE - 2-3 VIEW

[Series 1: run · 2 of 2 slices shown]
[im 1/2]
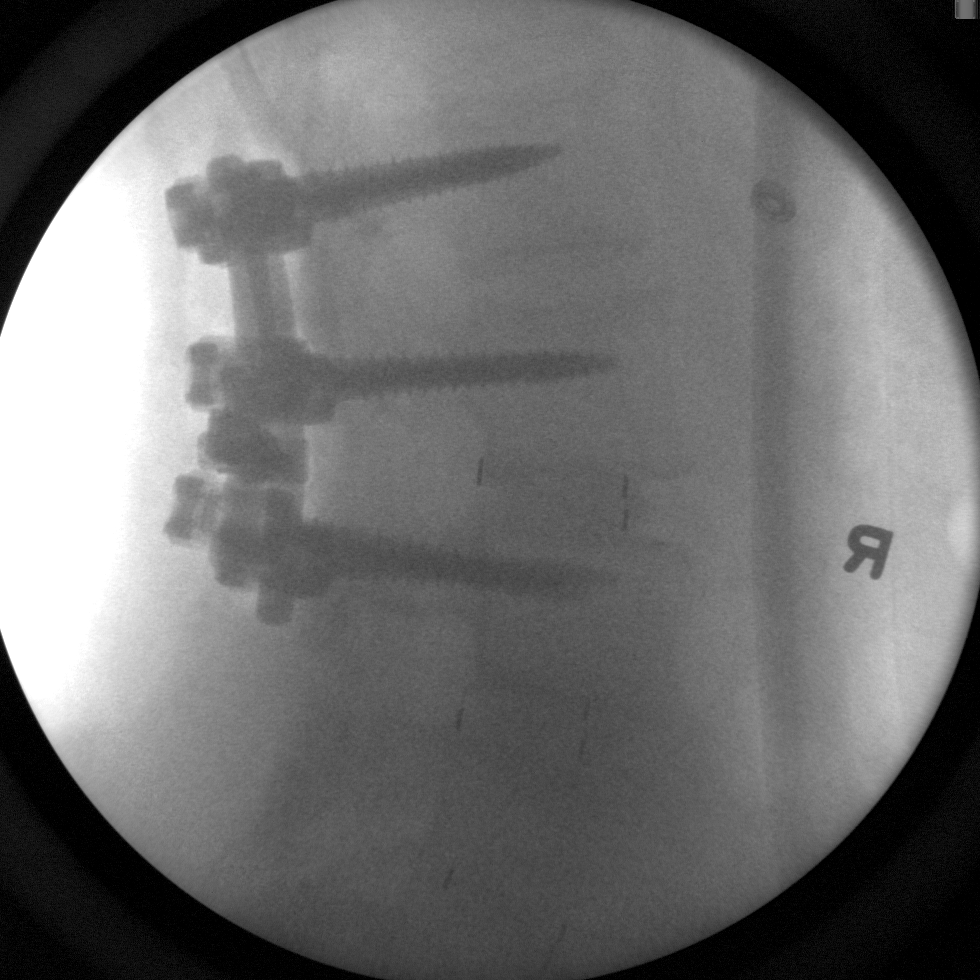
[im 2/2]
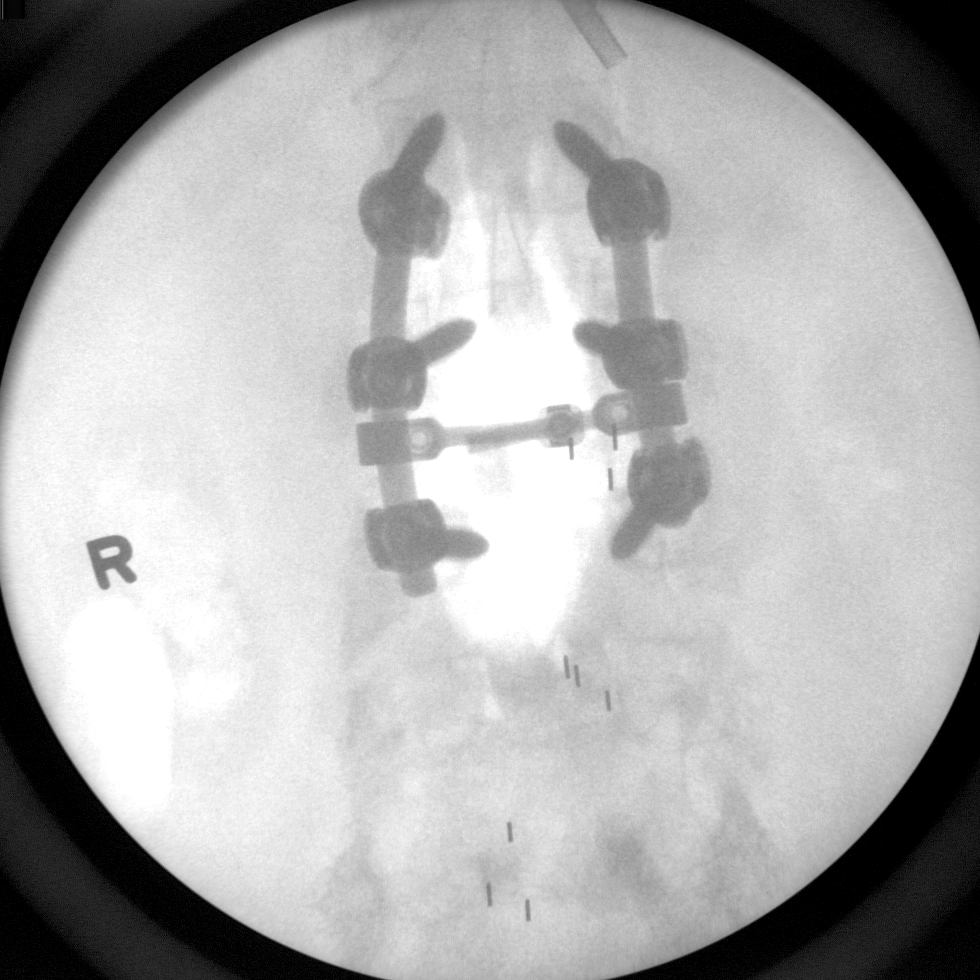

[2 of 2 positions shown; findings below may reference images not displayed]

FINDINGS: The lumbar fusion hardware from L3-S1 has been removed
since the prior CT scan.  New fusion hardware noted  L1 to L3.
IMPRESSION: Lumbar fusion hardware L1-L3 in good position without complicating
features

## 2012-05-07 ENCOUNTER — Other Ambulatory Visit: Payer: Self-pay | Admitting: Neurosurgery

## 2012-05-07 DIAGNOSIS — G8929 Other chronic pain: Secondary | ICD-10-CM

## 2012-05-13 ENCOUNTER — Ambulatory Visit
Admission: RE | Admit: 2012-05-13 | Discharge: 2012-05-13 | Disposition: A | Discharge status: Home / Self Care | Payer: Medicare Other | Source: Ambulatory Visit | Attending: Neurosurgery | Admitting: Neurosurgery

## 2012-05-13 VITALS — BP 187/116 | HR 70

## 2012-05-13 DIAGNOSIS — G8929 Other chronic pain: Secondary | ICD-10-CM

## 2012-05-13 DIAGNOSIS — M5126 Other intervertebral disc displacement, lumbar region: Secondary | ICD-10-CM

## 2012-05-13 MED ORDER — IOHEXOL 180 MG/ML  SOLN
1.0000 mL | Freq: Once | INTRAMUSCULAR | Status: AC | PRN
  Administered 2012-05-13: 1 mL via EPIDURAL

## 2012-05-13 MED ORDER — METHYLPREDNISOLONE ACETATE 40 MG/ML INJ SUSP (RADIOLOG
120.0000 mg | Freq: Once | INTRAMUSCULAR | Status: AC
  Administered 2012-05-13: 120 mg via EPIDURAL

## 2012-05-13 NOTE — Progress Notes (Addendum)
right leg is numb post injection, Dr. Carlota Raspberry in to visit. Pt taking po's well.  Dd  1430 pt able to bear weight and was taken to car via wheelchair.

## 2012-05-13 NOTE — Discharge Instructions (Addendum)
Post Procedure Spinal Discharge Instruction Sheet  1. You may resume a regular diet and any medications that you routinely take (including pain medications).  2. No driving day of procedure.  3. Light activity throughout the rest of the day.  Do not do any strenuous work, exercise, bending or lifting.  The day following the procedure, you can resume normal physical activity but you should refrain from exercising or physical therapy for at least three days thereafter.   Common Side Effects:   Headaches- take your usual medications as directed by your physician.  Increase your fluid intake.  Caffeinated beverages may be helpful.  Lie flat in bed until your headache resolves.   Restlessness or inability to sleep- you may have trouble sleeping for the next few days.  Ask your referring physician if you need any medication for sleep.   Facial flushing or redness- should subside within a few days.   Increased pain- a temporary increase in pain a day or two following your procedure is not unusual.  Take your pain medication as prescribed by your referring physician.   Leg cramps  Please contact our office at [phone] for the following symptoms:  Fever greater than 100 degrees.  Headaches unresolved with medication after 2-3 days.  Increased swelling, pain, or redness at injection site.  Thank you for visiting our office.   Resume coumadin tonight.

## 2012-08-06 ENCOUNTER — Other Ambulatory Visit: Payer: Self-pay | Admitting: Neurosurgery

## 2012-08-06 DIAGNOSIS — M549 Dorsalgia, unspecified: Secondary | ICD-10-CM

## 2012-08-15 ENCOUNTER — Ambulatory Visit
Admission: RE | Admit: 2012-08-15 | Discharge: 2012-08-15 | Disposition: A | Discharge status: Home / Self Care | Payer: Medicare Other | Source: Ambulatory Visit | Attending: Neurosurgery | Admitting: Neurosurgery

## 2012-08-15 ENCOUNTER — Other Ambulatory Visit: Payer: Self-pay | Admitting: Neurosurgery

## 2012-08-15 VITALS — BP 157/92 | HR 66

## 2012-08-15 DIAGNOSIS — M549 Dorsalgia, unspecified: Secondary | ICD-10-CM

## 2012-08-15 DIAGNOSIS — M5126 Other intervertebral disc displacement, lumbar region: Secondary | ICD-10-CM

## 2012-08-15 MED ORDER — METHYLPREDNISOLONE ACETATE 40 MG/ML INJ SUSP (RADIOLOG
120.0000 mg | Freq: Once | INTRAMUSCULAR | Status: AC
  Administered 2012-08-15: 60 mg via EPIDURAL

## 2012-08-15 MED ORDER — IOHEXOL 180 MG/ML  SOLN
1.0000 mL | Freq: Once | INTRAMUSCULAR | Status: AC | PRN
  Administered 2012-08-15: 2 mL via EPIDURAL

## 2012-08-15 MED ORDER — METHYLPREDNISOLONE ACETATE 40 MG/ML INJ SUSP (RADIOLOG
60.0000 mg | Freq: Once | INTRAMUSCULAR | Status: AC
  Administered 2012-08-15: 60 mg via INTRA_ARTICULAR

## 2012-09-18 ENCOUNTER — Other Ambulatory Visit: Payer: Self-pay | Admitting: Neurosurgery

## 2012-09-18 DIAGNOSIS — M549 Dorsalgia, unspecified: Secondary | ICD-10-CM

## 2012-09-22 ENCOUNTER — Inpatient Hospital Stay: Admission: RE | Admit: 2012-09-22 | Payer: Medicare Other | Source: Ambulatory Visit

## 2012-09-24 ENCOUNTER — Other Ambulatory Visit: Payer: Self-pay | Admitting: Neurosurgery

## 2012-09-24 ENCOUNTER — Ambulatory Visit
Admission: RE | Admit: 2012-09-24 | Discharge: 2012-09-24 | Disposition: A | Discharge status: Home / Self Care | Payer: Medicare Other | Source: Ambulatory Visit | Attending: Neurosurgery | Admitting: Neurosurgery

## 2012-09-24 DIAGNOSIS — M549 Dorsalgia, unspecified: Secondary | ICD-10-CM

## 2012-09-24 MED ORDER — METHYLPREDNISOLONE ACETATE 40 MG/ML INJ SUSP (RADIOLOG
120.0000 mg | Freq: Once | INTRAMUSCULAR | Status: AC
  Administered 2012-09-24: 120 mg via EPIDURAL

## 2012-09-24 MED ORDER — IOHEXOL 180 MG/ML  SOLN
1.0000 mL | Freq: Once | INTRAMUSCULAR | Status: AC | PRN
  Administered 2012-09-24: 1 mL via EPIDURAL

## 2012-12-05 ENCOUNTER — Other Ambulatory Visit: Payer: Self-pay | Admitting: Neurosurgery

## 2012-12-05 DIAGNOSIS — M549 Dorsalgia, unspecified: Secondary | ICD-10-CM

## 2012-12-18 ENCOUNTER — Ambulatory Visit
Admission: RE | Admit: 2012-12-18 | Discharge: 2012-12-18 | Disposition: A | Discharge status: Home / Self Care | Payer: Medicare Other | Source: Ambulatory Visit | Attending: Neurosurgery | Admitting: Neurosurgery

## 2012-12-18 ENCOUNTER — Other Ambulatory Visit: Payer: Self-pay | Admitting: Neurosurgery

## 2012-12-18 DIAGNOSIS — M549 Dorsalgia, unspecified: Secondary | ICD-10-CM

## 2012-12-18 MED ORDER — METHYLPREDNISOLONE ACETATE 40 MG/ML INJ SUSP (RADIOLOG
120.0000 mg | Freq: Once | INTRAMUSCULAR | Status: AC
  Administered 2012-12-18: 120 mg via EPIDURAL

## 2012-12-18 MED ORDER — IOHEXOL 180 MG/ML  SOLN
1.0000 mL | Freq: Once | INTRAMUSCULAR | Status: AC | PRN
  Administered 2012-12-18: 1 mL via EPIDURAL

## 2012-12-18 NOTE — Progress Notes (Signed)
Pt reminded to resume coumadin tonight.

## 2013-07-08 ENCOUNTER — Ambulatory Visit (HOSPITAL_COMMUNITY): Admission: RE | Admit: 2013-07-08 | Payer: Medicare Other | Source: Ambulatory Visit | Admitting: Anesthesiology

## 2013-07-08 ENCOUNTER — Encounter (HOSPITAL_COMMUNITY): Admission: RE | Payer: Self-pay | Source: Ambulatory Visit

## 2013-07-08 SURGERY — INSERTION, SPINAL CORD STIMULATOR, LUMBAR
Anesthesia: Monitor Anesthesia Care

## 2013-08-07 ENCOUNTER — Encounter (HOSPITAL_COMMUNITY): Payer: Self-pay | Admitting: Emergency Medicine

## 2013-08-07 ENCOUNTER — Emergency Department (INDEPENDENT_AMBULATORY_CARE_PROVIDER_SITE_OTHER)
Admission: EM | Admit: 2013-08-07 | Discharge: 2013-08-07 | Disposition: A | Discharge status: Home / Self Care | Payer: Medicare Other | Source: Home / Self Care | Attending: Family Medicine | Admitting: Family Medicine

## 2013-08-07 DIAGNOSIS — IMO0002 Reserved for concepts with insufficient information to code with codable children: Secondary | ICD-10-CM

## 2013-08-07 HISTORY — DX: Essential (primary) hypertension: I10

## 2013-08-07 NOTE — Discharge Instructions (Signed)
Warm compress twice a day for each for 3-4 days, see your doctor if further problems.

## 2013-08-07 NOTE — ED Notes (Signed)
Patient has a surgical site to right lower back with oozing incision.  Serous-sang drainage, redness around site

## 2013-08-07 NOTE — ED Provider Notes (Addendum)
CSN: [id number]     Arrival date & time 08/07/13  2002 History   None    Chief Complaint  Patient presents with  . Wound Dehiscence   (Consider location/radiation/quality/duration/timing/severity/associated sxs/prior Treatment) Patient is a 63 y.o. female presenting with wound check. The history is provided by the patient.  Wound Check This is a new problem. The current episode started 6 to 12 hours ago. The problem has been gradually worsening. Associated symptoms comments: Pt with tens unit and battery pack placed on 12/31, began with tender drainage from gluteal site today..    Past Medical History  Diagnosis Date  . Lumbar back pain   . Hypertension    Past Surgical History  Procedure Laterality Date  . Back surgery     No family history on file. History  Substance Use Topics  . Smoking status: Never Smoker   . Smokeless tobacco: Never Used  . Alcohol Use: No   OB History   Grav Para Term Preterm Abortions TAB SAB Ect Mult Living                 Review of Systems  Constitutional: Negative.   Musculoskeletal: Negative.   Skin: Positive for wound.    Allergies  Penicillins and Sulfa antibiotics  Home Medications   Current Outpatient Rx  Name  Route  Sig  Dispense  Refill  . allopurinol (ZYLOPRIM) 100 MG tablet   Oral   Take 100 mg by mouth daily.         . carvedilol (COREG) 12.5 MG tablet   Oral   Take 12.5 mg by mouth 2 (two) times daily with a meal.         . citalopram (CELEXA) 10 MG tablet   Oral   Take 10 mg by mouth daily.         . cyclobenzaprine (FLEXERIL) 10 MG tablet   Oral   Take 10 mg by mouth 3 (three) times daily as needed for muscle spasms.         Marland Kitchen. HYDROcodone-acetaminophen (NORCO/VICODIN) 5-325 MG per tablet   Oral   Take 1 tablet by mouth every 6 (six) hours as needed for moderate pain.         Marland Kitchen. warfarin (COUMADIN) 2.5 MG tablet   Oral   Take 2.5 mg by mouth daily.          BP 137/96  Pulse 104  Temp(Src)  99.5 F (37.5 C) (Oral)  Resp 20  SpO2 96% Physical Exam  Nursing note and vitals reviewed. Constitutional: She is oriented to person, place, and time. She appears well-developed and well-nourished.  Neurological: She is alert and oriented to person, place, and time.  Skin: Skin is warm and dry.  Serous drainage from right gluteal battery site, sl hyperemia,.bloody fluid expressed. Cultured, dsd.    ED Course  Procedures (including critical care time) Labs Review Labs Reviewed  WOUND CULTURE   Imaging Review No results found.    MDM   1. Noninfected postoperative seroma        Linna HoffJames D Eline Geng, MD 08/07/13 2135  Linna HoffJames D Bayle Calvo, MD 08/09/13 [phone]

## 2013-08-14 NOTE — ED Notes (Signed)
[facility], asking for sensitivity order for material submitted for culture on 1-30 visit. Written Rx faxed, attention "Malachi BondsGloria". 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

## 2013-08-28 NOTE — ED Notes (Signed)
[facility], w final report of wound culture. They still need an order for sensitivity, and have reportedly not received copy of lab order hand written and faxed over on 2-6 by this writer. Have called Dr Ollen BowlHarkins office, and faxed over copy of report, and have asked their office to co-ordinate this process for their patient

## 2013-08-28 NOTE — ED Notes (Addendum)
2/4 Beaded rod- pending.  2/13 I called [facility] and they said it is a "slow grower and it would take another week."  2/20  Abscess culture R buttocks: [place] Bronchialis.  Lab shown to Dr. Artis FlockKindl. I told him that Michiel CowboyNancy Stack RN  had already faxed it to Dr. Ollen BowlHarkins office. He said no further action by us, since it is Dr. Ollen BowlHarkins pt.  [name] said pt. had a tens unit and battery pack placed on 12/31 and site was draining, when pt. came here.  [name] said she had also sent a request for a sensitivity but it was not noted with culture report. I faxed the culture report to Dr. Ollen BowlHarkins office and confirmation received. Sierra Leach, Sierra Leach 08/28/2013

## 2013-08-31 ENCOUNTER — Encounter (HOSPITAL_COMMUNITY): Payer: Self-pay

## 2013-08-31 ENCOUNTER — Other Ambulatory Visit: Payer: Self-pay | Admitting: Anesthesiology

## 2013-09-02 ENCOUNTER — Encounter (HOSPITAL_COMMUNITY)
Admission: RE | Admit: 2013-09-02 | Discharge: 2013-09-02 | Disposition: A | Discharge status: Home / Self Care | Payer: Medicare Other | Source: Ambulatory Visit | Attending: Anesthesiology | Admitting: Anesthesiology

## 2013-09-02 ENCOUNTER — Other Ambulatory Visit (HOSPITAL_COMMUNITY): Payer: Self-pay | Admitting: *Deleted

## 2013-09-02 ENCOUNTER — Encounter (HOSPITAL_COMMUNITY): Payer: Self-pay

## 2013-09-02 LAB — URINALYSIS, ROUTINE W REFLEX MICROSCOPIC
Bilirubin Urine: NEGATIVE
Glucose, UA: NEGATIVE mg/dL
Ketones, ur: NEGATIVE mg/dL
Nitrite: NEGATIVE
Protein, ur: NEGATIVE mg/dL
Specific Gravity, Urine: 1.023 (ref 1.005–1.030)
Urobilinogen, UA: 0.2 mg/dL (ref 0.0–1.0)
pH: 5.5 (ref 5.0–8.0)

## 2013-09-02 LAB — BASIC METABOLIC PANEL
BUN: 12 mg/dL (ref 6–23)
CO2: 26 mEq/L (ref 19–32)
Calcium: 9.4 mg/dL (ref 8.4–10.5)
Chloride: 102 mEq/L (ref 96–112)
Creatinine, Ser: 0.95 mg/dL (ref 0.50–1.10)
GFR calc Af Amer: 74 mL/min — ABNORMAL LOW (ref >90)
GFR calc non Af Amer: 64 mL/min — ABNORMAL LOW (ref >90)
Glucose, Bld: 99 mg/dL (ref 70–99)
Potassium: 3.8 mEq/L (ref 3.7–5.3)
Sodium: 143 mEq/L (ref 137–147)

## 2013-09-02 LAB — URINE MICROSCOPIC-ADD ON

## 2013-09-02 LAB — CBC WITH DIFFERENTIAL/PLATELET
Basophils Absolute: 0 10*3/uL (ref 0.0–0.1)
Basophils Relative: 0 % (ref 0–1)
Eosinophils Absolute: 0.2 10*3/uL (ref 0.0–0.7)
Eosinophils Relative: 4 % (ref 0–5)
HCT: 38.5 % (ref 36.0–46.0)
Hemoglobin: 12.6 g/dL (ref 12.0–15.0)
Lymphocytes Relative: 35 % (ref 12–46)
Lymphs Abs: 2 10*3/uL (ref 0.7–4.0)
MCH: 28.7 pg (ref 26.0–34.0)
MCHC: 32.7 g/dL (ref 30.0–36.0)
MCV: 87.7 fL (ref 78.0–100.0)
Monocytes Absolute: 0.5 10*3/uL (ref 0.1–1.0)
Monocytes Relative: 8 % (ref 3–12)
Neutro Abs: 3 10*3/uL (ref 1.7–7.7)
Neutrophils Relative %: 53 % (ref 43–77)
Platelets: 254 10*3/uL (ref 150–400)
RBC: 4.39 MIL/uL (ref 3.87–5.11)
RDW: 14.1 % (ref 11.5–15.5)
WBC: 5.7 10*3/uL (ref 4.0–10.5)

## 2013-09-02 LAB — SURGICAL PCR SCREEN
MRSA, PCR: NEGATIVE
Staphylococcus aureus: NEGATIVE

## 2013-09-02 LAB — PROTIME-INR
INR: 2.17 — ABNORMAL HIGH (ref 0.00–1.49)
Prothrombin Time: 23.5 seconds — ABNORMAL HIGH (ref 11.6–15.2)

## 2013-09-02 LAB — APTT: aPTT: 33 seconds (ref 24–37)

## 2013-09-02 NOTE — Pre-Procedure Instructions (Signed)
Sierra GottronWanda C Brinegar  09/02/2013   Your procedure is scheduled on:  09/04/13  [facility] at 1200 AM.  Call this number if you have problems the morning of surgery: [phone]   Remember:   Do not eat food or drink liquids after midnight.   Take these medicines the morning of surgery with A SIP OF WATER: allopurinol, carvedilol, celexa ,                flexeril, pain med if needed          Coumadin per dr          Despina AriasSTOP all herbel meds, nsaids (aleve,naproxen,advil,ibuprofen) now including vitamins, aspirin   Do not wear jewelry, make-up or nail polish.  Do not wear lotions, powders, or perfumes. You may wear deodorant.  Do not shave 48 hours prior to surgery. Men may shave face and neck.  Do not bring valuables to the hospital.  [facility] is not responsible                  for any belongings or valuables.               Contacts, dentures or bridgework may not be worn into surgery.  Leave suitcase in the car. After surgery it may be brought to your room.  For patients admitted to the hospital, discharge time is determined by your                treatment team.               Patients discharged the day of surgery will not be allowed to drive  home.  Name and phone number of your driver:   Special Instructions:  Special Instructions: [place] - Preparing for Surgery  Before surgery, you can play an important role.  Because skin is not sterile, your skin needs to be as free of germs as possible.  You can reduce the number of germs on you skin by washing with CHG (chlorahexidine gluconate) soap before surgery.  CHG is an antiseptic cleaner which kills germs and bonds with the skin to continue killing germs even after washing.  Please DO NOT use if you have an allergy to CHG or antibacterial soaps.  If your skin becomes reddened/irritated stop using the CHG and inform your nurse when you arrive at Short Stay.  Do not shave (including legs and underarms) for at  least 48 hours prior to the first CHG shower.  You may shave your face.  Please follow these instructions carefully:   1.  Shower with CHG Soap the night before surgery and the morning of Surgery.  2.  If you choose to wash your hair, wash your hair first as usual with your normal shampoo.  3.  After you shampoo, rinse your hair and body thoroughly to remove the Shampoo.  4.  Use CHG as you would any other liquid soap.  You can apply chg directly  to the skin and wash gently with scrungie or a clean washcloth.  5.  Apply the CHG Soap to your body ONLY FROM THE NECK DOWN.  Do not use on open wounds or open sores.  Avoid contact with your eyes ears, mouth and genitals (private parts).  Wash genitals (private parts)       with your normal soap.  6.  Wash thoroughly, paying special attention to the area where your surgery will be performed.  7.  Thoroughly rinse your body with warm water from the neck down.  8.  DO NOT shower/wash with your normal soap after using and rinsing off the CHG Soap.  9.  Pat yourself dry with a clean towel.            10.  Wear clean pajamas.            11.  Place clean sheets on your bed the night of your first shower and do not sleep with pets.  Day of Surgery  Do not apply any lotions/deodorants the morning of surgery.  Please wear clean clothes to the hospital/surgery center.   Please read over the following fact sheets that you were given: Pain Booklet, Coughing and Deep Breathing and Surgical Site Infection Prevention

## 2013-09-02 NOTE — Progress Notes (Addendum)
Coumadin stopped 08/31/13

## 2013-09-02 NOTE — Pre-Procedure Instructions (Signed)
Sierra GottronWanda C Radell  09/02/2013   Your procedure is scheduled on:  09/04/13  [facility] at 1200 AM.  Call this number if you have problems the morning of surgery: [phone]   Remember:   Do not eat food or drink liquids after midnight.   Take these medicines the morning of surgery with A SIP OF WATER: allopurinol, carvedilol, celexa ,                flexeril, pain med if needed          Coumadin per dr   Drucilla Schmidto not wear jewelry, make-up or nail polish.  Do not wear lotions, powders, or perfumes. You may wear deodorant.  Do not shave 48 hours prior to surgery. Men may shave face and neck.  Do not bring valuables to the hospital.  [facility] is not responsible                  for any belongings or valuables.               Contacts, dentures or bridgework may not be worn into surgery.  Leave suitcase in the car. After surgery it may be brought to your room.  For patients admitted to the hospital, discharge time is determined by your                treatment team.               Patients discharged the day of surgery will not be allowed to drive  home.  Name and phone number of your driver:   Special Instructions:  Special Instructions: [place] - Preparing for Surgery  Before surgery, you can play an important role.  Because skin is not sterile, your skin needs to be as free of germs as possible.  You can reduce the number of germs on you skin by washing with CHG (chlorahexidine gluconate) soap before surgery.  CHG is an antiseptic cleaner which kills germs and bonds with the skin to continue killing germs even after washing.  Please DO NOT use if you have an allergy to CHG or antibacterial soaps.  If your skin becomes reddened/irritated stop using the CHG and inform your nurse when you arrive at Short Stay.  Do not shave (including legs and underarms) for at least 48 hours prior to the first CHG shower.  You may shave your face.  Please follow these  instructions carefully:   1.  Shower with CHG Soap the night before surgery and the morning of Surgery.  2.  If you choose to wash your hair, wash your hair first as usual with your normal shampoo.  3.  After you shampoo, rinse your hair and body thoroughly to remove the Shampoo.  4.  Use CHG as you would any other liquid soap.  You can apply chg directly  to the skin and wash gently with scrungie or a clean washcloth.  5.  Apply the CHG Soap to your body ONLY FROM THE NECK DOWN.  Do not use on open wounds or open sores.  Avoid contact with your eyes ears, mouth and genitals (private parts).  Wash genitals (private parts)       with your normal soap.  6.  Wash thoroughly, paying special attention to the area where your surgery will be performed.  7.  Thoroughly rinse your body with warm water from the neck down.  8.  DO NOT shower/wash  with your normal soap after using and rinsing off the CHG Soap.  9.  Pat yourself dry with a clean towel.            10.  Wear clean pajamas.            11.  Place clean sheets on your bed the night of your first shower and do not sleep with pets.  Day of Surgery  Do not apply any lotions/deodorants the morning of surgery.  Please wear clean clothes to the hospital/surgery center.   Please read over the following fact sheets that you were given: Pain Booklet, Coughing and Deep Breathing and Surgical Site Infection Prevention

## 2013-09-03 ENCOUNTER — Inpatient Hospital Stay (HOSPITAL_COMMUNITY): Admission: RE | Admit: 2013-09-03 | Payer: Medicare Other | Source: Ambulatory Visit

## 2013-09-03 HISTORY — DX: Gastro-esophageal reflux disease without esophagitis: K21.9

## 2013-09-03 HISTORY — DX: Other specified postprocedural states: R11.2

## 2013-09-03 HISTORY — DX: Other specified postprocedural states: Z98.890

## 2013-09-03 HISTORY — DX: Personal history of urinary calculi: Z87.442

## 2013-09-03 HISTORY — DX: Anxiety disorder, unspecified: F41.9

## 2013-09-03 HISTORY — DX: Cerebral infarction, unspecified: I63.9

## 2013-09-03 HISTORY — DX: Peripheral vascular disease, unspecified: I73.9

## 2013-09-03 MED ORDER — VANCOMYCIN HCL IN DEXTROSE 1-5 GM/200ML-% IV SOLN
1000.0000 mg | INTRAVENOUS | Status: AC
  Administered 2013-09-04: 1000 mg via INTRAVENOUS
  Filled 2013-09-03: qty 200

## 2013-09-03 NOTE — Progress Notes (Signed)
Anesthesia Chart Review:  Patient is a 63 year old female scheduled for I&D of lumbar wound on 09/04/13 b50y Dr. Ollen BowlHarkins. 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 BMI is 42.61 consistent with morbid obesity.  EKG on 09/02/13 showed NSR, non-specific T wave abnormality.  CXR on 09/02/13 showed no active cardiopulmonary disease.  Preoperative labs noted.  PT/INR 23.5/2.17. Coumadin was held starting 08/31/13.  She will need a repeat PT/INR on arrival.  UA showed large hgb, small leukocytes, negative nitrites, many bacteria. PT/INR and UA results called to Dr. Ollen BowlHarkins.  Plan to repeat STAT PT/INR on arrival.  If results acceptable then I anticipate that she can proceed as planned.  Velna Ochsllison Zelenak, PA-C [facility]/Anesthesiology Phone [phone] 09/03/2013 2:07 PM

## 2013-09-04 ENCOUNTER — Ambulatory Visit (HOSPITAL_COMMUNITY)
Admission: RE | Admit: 2013-09-04 | Discharge: 2013-09-04 | Disposition: A | Discharge status: Home / Self Care | Payer: Medicare Other | Source: Ambulatory Visit | Attending: Anesthesiology | Admitting: Anesthesiology

## 2013-09-04 ENCOUNTER — Inpatient Hospital Stay (HOSPITAL_COMMUNITY): Payer: Medicare Other | Admitting: Certified Registered Nurse Anesthetist

## 2013-09-04 ENCOUNTER — Encounter (HOSPITAL_COMMUNITY)
Admission: RE | Disposition: A | Discharge status: Home / Self Care | Payer: Self-pay | Source: Ambulatory Visit | Attending: Anesthesiology

## 2013-09-04 ENCOUNTER — Encounter (HOSPITAL_COMMUNITY): Payer: Medicare Other | Admitting: Vascular Surgery

## 2013-09-04 DIAGNOSIS — R29898 Other symptoms and signs involving the musculoskeletal system: Secondary | ICD-10-CM | POA: Insufficient documentation

## 2013-09-04 DIAGNOSIS — I10 Essential (primary) hypertension: Secondary | ICD-10-CM | POA: Insufficient documentation

## 2013-09-04 DIAGNOSIS — T85738A Infection and inflammatory reaction due to other nervous system device, implant or graft, initial encounter: Secondary | ICD-10-CM | POA: Insufficient documentation

## 2013-09-04 DIAGNOSIS — M961 Postlaminectomy syndrome, not elsewhere classified: Secondary | ICD-10-CM | POA: Insufficient documentation

## 2013-09-04 DIAGNOSIS — Z7901 Long term (current) use of anticoagulants: Secondary | ICD-10-CM | POA: Insufficient documentation

## 2013-09-04 DIAGNOSIS — I739 Peripheral vascular disease, unspecified: Secondary | ICD-10-CM | POA: Insufficient documentation

## 2013-09-04 DIAGNOSIS — Z79899 Other long term (current) drug therapy: Secondary | ICD-10-CM | POA: Insufficient documentation

## 2013-09-04 DIAGNOSIS — I69998 Other sequelae following unspecified cerebrovascular disease: Secondary | ICD-10-CM | POA: Insufficient documentation

## 2013-09-04 DIAGNOSIS — Z86711 Personal history of pulmonary embolism: Secondary | ICD-10-CM | POA: Insufficient documentation

## 2013-09-04 DIAGNOSIS — F411 Generalized anxiety disorder: Secondary | ICD-10-CM | POA: Insufficient documentation

## 2013-09-04 DIAGNOSIS — Y838 Other surgical procedures as the cause of abnormal reaction of the patient, or of later complication, without mention of misadventure at the time of the procedure: Secondary | ICD-10-CM | POA: Insufficient documentation

## 2013-09-04 DIAGNOSIS — K219 Gastro-esophageal reflux disease without esophagitis: Secondary | ICD-10-CM | POA: Insufficient documentation

## 2013-09-04 HISTORY — PX: LUMBAR WOUND DEBRIDEMENT: SHX1988

## 2013-09-04 LAB — PROTIME-INR
INR: 1.45 (ref 0.00–1.49)
PROTHROMBIN TIME: 17.3 s — AB (ref 11.6–15.2)

## 2013-09-04 SURGERY — LUMBAR WOUND DEBRIDEMENT
Anesthesia: Monitor Anesthesia Care

## 2013-09-04 MED ORDER — BUPIVACAINE-EPINEPHRINE (PF) 0.5% -1:200000 IJ SOLN
INTRAMUSCULAR | Status: DC | PRN
  Administered 2013-09-04: 16 mL

## 2013-09-04 MED ORDER — BACITRACIN ZINC 500 UNIT/GM EX OINT
TOPICAL_OINTMENT | CUTANEOUS | Status: DC | PRN
  Administered 2013-09-04: 1 via TOPICAL

## 2013-09-04 MED ORDER — PROPOFOL 10 MG/ML IV BOLUS
INTRAVENOUS | Status: AC
  Filled 2013-09-04: qty 20

## 2013-09-04 MED ORDER — PROPOFOL INFUSION 10 MG/ML OPTIME
INTRAVENOUS | Status: DC | PRN
  Administered 2013-09-04: 100 ug/kg/min via INTRAVENOUS

## 2013-09-04 MED ORDER — GLYCOPYRROLATE 0.2 MG/ML IJ SOLN
INTRAMUSCULAR | Status: AC
  Filled 2013-09-04: qty 3

## 2013-09-04 MED ORDER — LIDOCAINE HCL (CARDIAC) 20 MG/ML IV SOLN
INTRAVENOUS | Status: DC | PRN
  Administered 2013-09-04: 60 mg via INTRAVENOUS

## 2013-09-04 MED ORDER — HYDROMORPHONE HCL PF 1 MG/ML IJ SOLN
0.2500 mg | INTRAMUSCULAR | Status: DC | PRN
  Administered 2013-09-04: 0.5 mg via INTRAVENOUS

## 2013-09-04 MED ORDER — SODIUM CHLORIDE 0.9 % IR SOLN
Status: DC | PRN
  Administered 2013-09-04: 13:00:00

## 2013-09-04 MED ORDER — ONDANSETRON HCL 4 MG/2ML IJ SOLN
INTRAMUSCULAR | Status: DC | PRN
  Administered 2013-09-04: 4 mg via INTRAVENOUS

## 2013-09-04 MED ORDER — SUCCINYLCHOLINE CHLORIDE 20 MG/ML IJ SOLN
INTRAMUSCULAR | Status: DC | PRN
  Administered 2013-09-04: 100 mg via INTRAVENOUS

## 2013-09-04 MED ORDER — DEXAMETHASONE SODIUM PHOSPHATE 4 MG/ML IJ SOLN
INTRAMUSCULAR | Status: DC | PRN
  Administered 2013-09-04: 4 mg via INTRAVENOUS

## 2013-09-04 MED ORDER — ONDANSETRON HCL 4 MG/2ML IJ SOLN
INTRAMUSCULAR | Status: AC
  Filled 2013-09-04: qty 2

## 2013-09-04 MED ORDER — FENTANYL CITRATE 0.05 MG/ML IJ SOLN
INTRAMUSCULAR | Status: AC
  Filled 2013-09-04: qty 5

## 2013-09-04 MED ORDER — FENTANYL CITRATE 0.05 MG/ML IJ SOLN
INTRAMUSCULAR | Status: DC | PRN
  Administered 2013-09-04: 25 ug via INTRAVENOUS
  Administered 2013-09-04 (×2): 50 ug via INTRAVENOUS
  Administered 2013-09-04: 25 ug via INTRAVENOUS
  Administered 2013-09-04 (×2): 50 ug via INTRAVENOUS

## 2013-09-04 MED ORDER — ONDANSETRON HCL 4 MG/2ML IJ SOLN
4.0000 mg | Freq: Once | INTRAMUSCULAR | Status: AC | PRN
  Administered 2013-09-04: 4 mg via INTRAVENOUS

## 2013-09-04 MED ORDER — OXYCODONE HCL 5 MG PO TABS
5.0000 mg | ORAL_TABLET | Freq: Once | ORAL | Status: AC | PRN
  Administered 2013-09-04: 5 mg via ORAL

## 2013-09-04 MED ORDER — LACTATED RINGERS IV SOLN
INTRAVENOUS | Status: DC | PRN
  Administered 2013-09-04: 13:00:00 via INTRAVENOUS

## 2013-09-04 MED ORDER — OXYCODONE HCL 5 MG/5ML PO SOLN: 5.0000 mg | Freq: Once | ORAL | Status: AC | PRN

## 2013-09-04 MED ORDER — MIDAZOLAM HCL 2 MG/2ML IJ SOLN
INTRAMUSCULAR | Status: AC
  Filled 2013-09-04: qty 2

## 2013-09-04 MED ORDER — MIDAZOLAM HCL 5 MG/5ML IJ SOLN
INTRAMUSCULAR | Status: DC | PRN
  Administered 2013-09-04 (×2): 2 mg via INTRAVENOUS

## 2013-09-04 MED ORDER — OXYCODONE HCL 5 MG PO TABS
ORAL_TABLET | ORAL | Status: AC
  Filled 2013-09-04: qty 1

## 2013-09-04 MED ORDER — LIDOCAINE HCL (CARDIAC) 20 MG/ML IV SOLN
INTRAVENOUS | Status: AC
  Filled 2013-09-04: qty 5

## 2013-09-04 MED ORDER — ROCURONIUM BROMIDE 100 MG/10ML IV SOLN
INTRAVENOUS | Status: DC | PRN
  Administered 2013-09-04: 20 mg via INTRAVENOUS

## 2013-09-04 MED ORDER — PROPOFOL 10 MG/ML IV BOLUS
INTRAVENOUS | Status: DC | PRN
  Administered 2013-09-04: 80 mg via INTRAVENOUS
  Administered 2013-09-04: 40 mg via INTRAVENOUS

## 2013-09-04 MED ORDER — CLINDAMYCIN HCL 300 MG PO CAPS: 300.0000 mg | ORAL_CAPSULE | Freq: Four times a day (QID) | ORAL | Status: DC

## 2013-09-04 MED ORDER — MEPERIDINE HCL 25 MG/ML IJ SOLN: 6.2500 mg | INTRAMUSCULAR | Status: DC | PRN

## 2013-09-04 MED ORDER — NEOSTIGMINE METHYLSULFATE 1 MG/ML IJ SOLN
INTRAMUSCULAR | Status: AC
  Filled 2013-09-04: qty 10

## 2013-09-04 MED ORDER — GLYCOPYRROLATE 0.2 MG/ML IJ SOLN
INTRAMUSCULAR | Status: DC | PRN
  Administered 2013-09-04: 0.4 mg via INTRAVENOUS

## 2013-09-04 MED ORDER — ROCURONIUM BROMIDE 50 MG/5ML IV SOLN
INTRAVENOUS | Status: AC
  Filled 2013-09-04: qty 1

## 2013-09-04 MED ORDER — NEOSTIGMINE METHYLSULFATE 1 MG/ML IJ SOLN
INTRAMUSCULAR | Status: DC | PRN
  Administered 2013-09-04: 3 mg via INTRAVENOUS

## 2013-09-04 MED ORDER — 0.9 % SODIUM CHLORIDE (POUR BTL) OPTIME
TOPICAL | Status: DC | PRN
  Administered 2013-09-04: 1000 mL

## 2013-09-04 MED ORDER — HYDROCODONE-ACETAMINOPHEN 5-325 MG PO TABS: 1.0000 | ORAL_TABLET | ORAL | Status: DC | PRN

## 2013-09-04 MED ORDER — HYDROMORPHONE HCL PF 1 MG/ML IJ SOLN
INTRAMUSCULAR | Status: AC
  Filled 2013-09-04: qty 1

## 2013-09-04 MED ORDER — PHENYLEPHRINE HCL 10 MG/ML IJ SOLN
INTRAMUSCULAR | Status: DC | PRN
  Administered 2013-09-04: 120 ug via INTRAVENOUS

## 2013-09-04 MED ORDER — LACTATED RINGERS IV SOLN: INTRAVENOUS | Status: DC

## 2013-09-04 SURGICAL SUPPLY — 64 items
3000 CC IRRIGATION NS ×2 IMPLANT
BAG DECANTER FOR FLEXI CONT (MISCELLANEOUS) ×2 IMPLANT
BENZOIN TINCTURE PRP APPL 2/3 (GAUZE/BANDAGES/DRESSINGS) IMPLANT
BINDER ABD UNIV 12 45-62 (WOUND CARE) ×1 IMPLANT
BINDER ABDOMINAL 46IN 62IN (WOUND CARE) ×2
BLADE SURG ROTATE 9660 (MISCELLANEOUS) IMPLANT
CHLORAPREP W/TINT 26ML (MISCELLANEOUS) ×4 IMPLANT
CONT SPEC 4OZ CLIKSEAL STRL BL (MISCELLANEOUS) ×2 IMPLANT
DERMABOND ADHESIVE PROPEN (GAUZE/BANDAGES/DRESSINGS)
DERMABOND ADVANCED .7 DNX6 (GAUZE/BANDAGES/DRESSINGS) IMPLANT
DRAIN JACKSON PRATT 1/4 1325 (MISCELLANEOUS) ×2 IMPLANT
DRAIN JACKSON PRATT 10MM FLAT (MISCELLANEOUS) ×2 IMPLANT
DRAPE INCISE IOBAN 66X45 STRL (DRAPES) ×2 IMPLANT
DRAPE LAPAROTOMY 100X72X124 (DRAPES) ×4 IMPLANT
DRAPE POUCH INSTRU U-SHP 10X18 (DRAPES) ×4 IMPLANT
DRAPE SURG 17X23 STRL (DRAPES) IMPLANT
DRESSING TELFA 8X3 (GAUZE/BANDAGES/DRESSINGS) ×2 IMPLANT
DRSG OPSITE POSTOP 3X4 (GAUZE/BANDAGES/DRESSINGS) ×2 IMPLANT
DRSG OPSITE POSTOP 4X6 (GAUZE/BANDAGES/DRESSINGS) ×2 IMPLANT
ELECT REM PT RETURN 9FT ADLT (ELECTROSURGICAL) ×4
ELECTRODE REM PT RTRN 9FT ADLT (ELECTROSURGICAL) ×2 IMPLANT
EVACUATOR SILICONE 100CC (DRAIN) ×2 IMPLANT
GAUZE SPONGE 4X4 16PLY XRAY LF (GAUZE/BANDAGES/DRESSINGS) ×2 IMPLANT
GLOVE ECLIPSE 7.5 STRL STRAW (GLOVE) ×10 IMPLANT
GLOVE EXAM NITRILE LRG STRL (GLOVE) IMPLANT
GLOVE EXAM NITRILE MD LF STRL (GLOVE) IMPLANT
GLOVE EXAM NITRILE XL STR (GLOVE) IMPLANT
GLOVE EXAM NITRILE XS STR PU (GLOVE) IMPLANT
GLOVE INDICATOR 7.5 STRL GRN (GLOVE) ×6 IMPLANT
GOWN BRE IMP SLV AUR LG STRL (GOWN DISPOSABLE) IMPLANT
GOWN BRE IMP SLV AUR XL STRL (GOWN DISPOSABLE) IMPLANT
GOWN SPEC L3 XXLG W/TWL (GOWN DISPOSABLE) ×4 IMPLANT
GOWN STRL REIN 2XL LVL4 (GOWN DISPOSABLE) IMPLANT
GOWN STRL REUS W/ TWL LRG LVL3 (GOWN DISPOSABLE) ×2 IMPLANT
GOWN STRL REUS W/TWL LRG LVL3 (GOWN DISPOSABLE) ×2
HANDPIECE INTERPULSE COAX TIP (DISPOSABLE) ×1
KIT BASIN OR (CUSTOM PROCEDURE TRAY) ×2 IMPLANT
KIT ROOM TURNOVER OR (KITS) ×2 IMPLANT
NEEDLE HYPO 25X1 1.5 SAFETY (NEEDLE) ×2 IMPLANT
NS IRRIG 1000ML POUR BTL (IV SOLUTION) ×2 IMPLANT
PACK LAMINECTOMY NEURO (CUSTOM PROCEDURE TRAY) ×2 IMPLANT
PAD ARMBOARD 7.5X6 YLW CONV (MISCELLANEOUS) ×2 IMPLANT
PENCIL BUTTON HOLSTER BLD 10FT (ELECTRODE) ×2 IMPLANT
SET HNDPC FAN SPRY TIP SCT (DISPOSABLE) ×1 IMPLANT
SPONGE GAUZE 4X4 12PLY (GAUZE/BANDAGES/DRESSINGS) ×2 IMPLANT
SPONGE LAP 4X18 X RAY DECT (DISPOSABLE) ×2 IMPLANT
SPONGE SURGIFOAM ABS GEL SZ50 (HEMOSTASIS) IMPLANT
STAPLER SKIN PROX WIDE 3.9 (STAPLE) ×6 IMPLANT
STRIP CLOSURE SKIN 1/2X4 (GAUZE/BANDAGES/DRESSINGS) IMPLANT
SUT ETHILON 3 0 FSL (SUTURE) ×2 IMPLANT
SUT MNCRL AB 4-0 PS2 18 (SUTURE) IMPLANT
SUT SILK 0 (SUTURE) ×1
SUT SILK 0 MO-6 18XCR BRD 8 (SUTURE) ×1 IMPLANT
SUT SILK 0 TIES 10X30 (SUTURE) IMPLANT
SUT SILK 2 0 TIES 10X30 (SUTURE) IMPLANT
SUT VIC AB 2-0 CP2 18 (SUTURE) ×4 IMPLANT
SWAB COLLECTION DEVICE MRSA (MISCELLANEOUS) ×2 IMPLANT
TOWEL OR 17X24 6PK STRL BLUE (TOWEL DISPOSABLE) ×4 IMPLANT
TOWEL OR 17X26 10 PK STRL BLUE (TOWEL DISPOSABLE) ×2 IMPLANT
TUBE ANAEROBIC SPECIMEN COL (MISCELLANEOUS) ×2 IMPLANT
TUBE CONNECTING 12X1/4 (SUCTIONS) ×2 IMPLANT
WATER STERILE IRR 1000ML POUR (IV SOLUTION) ×2 IMPLANT
WRENCH HEX 4.3 (SPINAL CORD STIMULATOR) ×2 IMPLANT
YANKAUER SUCT BULB TIP NO VENT (SUCTIONS) ×4 IMPLANT

## 2013-09-04 NOTE — H&P (Signed)
Sierra Leach is an 63 y.o. female.   Chief Complaint: incision breakdown  HPI: patient with lumbar post-laminectomy syndrome, SCS placed approximately 2 months ago. Starting 3-4 weeks ago, patient reported incision over IPG opening. Evaluated multiple times by multiple providers; skin open, no area of SCS exposure. Last week, other incision appears open. Labs normal.   Past Medical History  Diagnosis Date  . Lumbar back pain   . Hypertension   . PONV (postoperative nausea and vomiting)   . Anxiety   . Stroke 99    lefe leg weakness  . Peripheral vascular disease 13    pulmonary embolus  . History of kidney stones   . GERD (gastroesophageal reflux disease)     occ    Past Surgical History  Procedure Laterality Date  . Back surgery      x8  . Shoulder arthroscopy Left 03    rotatoer cuff  . Shoulder arthroscopy Right 99    bone spur  . Breast surgery Left     lumpectomy x3    No family history on file. Social History:  reports that she has never smoked. She has never used smokeless tobacco. She reports that she does not drink alcohol or use illicit drugs.  Allergies:  Allergies  Allergen Reactions  . Penicillins Hives and Itching  . Sulfa Antibiotics Other (See Comments)    Breaks mouth out in blisters  . Adhesive [Tape] Rash    Medications Prior to Admission  Medication Sig Dispense Refill  . allopurinol (ZYLOPRIM) 100 MG tablet Take 100 mg by mouth daily.      . bacitracin 500 UNIT/GM ointment Apply 1 application topically 3 (three) times daily. Apply to incisions 3 times a day      . carvedilol (COREG) 12.5 MG tablet Take 12.5 mg by mouth 2 (two) times daily with a meal.      . citalopram (CELEXA) 10 MG tablet Take 10 mg by mouth daily.      . cyclobenzaprine (FLEXERIL) 10 MG tablet Take 10 mg by mouth 3 (three) times daily as needed for muscle spasms.      . diazepam (VALIUM) 5 MG tablet Take 5 mg by mouth every 6 (six) hours as needed for muscle spasms or  sedation.      . docusate sodium (COLACE) 100 MG capsule Take 200 mg by mouth at bedtime.      Marland Kitchen. HYDROcodone-acetaminophen (NORCO/VICODIN) 5-325 MG per tablet Take 1 tablet by mouth every 6 (six) hours as needed for moderate pain.      Marland Kitchen. warfarin (COUMADIN) 2.5 MG tablet Take 2.5-5 mg by mouth See admin instructions. Day 1-(5 mg), Day 2-(5 mg), Day 3-(2.5) mg then repeat        Results for orders placed during the hospital encounter of 09/04/13 (from the past 48 hour(s))  PROTIME-INR     Status: Abnormal   Collection Time    09/04/13 10:40 AM      Result Value Ref Range   Prothrombin Time 17.3 (*) 11.6 - 15.2 seconds   INR 1.45  0.00 - 1.49   Dg Chest 2 View  09/02/2013   CLINICAL DATA:  Preop for I and D, lumbar wound  EXAM: CHEST  2 VIEW  COMPARISON:  None.  FINDINGS: The heart size and mediastinal contours are within normal limits. Both lungs are clear. Spinal stimulation wires are noted mid thoracic spine. Postsurgical changes lumbar spine. Mild elevation of the right hemidiaphragm. Metallic fixation plate  cervical spine.  IMPRESSION: No active cardiopulmonary disease.   Electronically Signed   By: Natasha Mead M.D.   On: 09/02/2013 14:59    Review of Systems  Constitutional: Negative.   HENT: Negative.   Eyes: Negative.   Respiratory: Negative.   Gastrointestinal: Negative.   Musculoskeletal: Positive for back pain.  Skin: Negative.   Neurological: Negative.   Endo/Heme/Allergies: Bruises/bleeds easily.    Blood pressure 150/84, pulse 68, temperature 97.5 F (36.4 C), temperature source Oral, resp. rate 18, SpO2 99.00%. Physical Exam  Constitutional: She is oriented to person, place, and time. She appears well-developed and well-nourished.  Eyes: EOM are normal. Pupils are equal, round, and reactive to light.  Neck: Normal range of motion.  Cardiovascular: Normal rate and regular rhythm.   Neurological: She is alert and oriented to person, place, and time.  Skin: Skin is  warm.  Psychiatric: She has a normal mood and affect. Her behavior is normal. Judgment and thought content normal.     Assessment/Plan A: s/p SCS now with poor healing, question of infection P: explore, revise or remove SCS depending on findings  Gwynne Edinger 09/04/2013, 12:58 PM

## 2013-09-04 NOTE — Discharge Instructions (Signed)

## 2013-09-04 NOTE — Transfer of Care (Signed)
Immediate Anesthesia Transfer of Care Note  Patient: Sierra GottronWanda C Butz  Procedure(s) Performed: Procedure(s) with comments: irrigation and debridement of lumbar wound (N/A) - irrigation and debridement of lumbar wound  Patient Location: PACU  Anesthesia Type:General  Level of Consciousness: awake, alert  and oriented  Airway & Oxygen Therapy: Patient Spontanous Breathing and Patient connected to nasal cannula oxygen  Post-op Assessment: Report given to PACU RN, Post -op Vital signs reviewed and stable and Patient moving all extremities  Post vital signs: Reviewed and stable  Complications: No apparent anesthesia complications

## 2013-09-04 NOTE — OR Nursing (Signed)
During procedure patient position back to bed per crna and surgeon request due to patient vomiting, patient reposition prone @1418  procedure continued

## 2013-09-04 NOTE — Preoperative (Signed)
Beta Blockers   Reason not to administer Beta Blockers:Not Applicable 

## 2013-09-04 NOTE — Anesthesia Procedure Notes (Signed)
Procedure Name: Intubation Date/Time: 09/04/2013 2:06 PM Performed by: Orvilla FusATO, Sentoria Brent A Pre-anesthesia Checklist: Patient identified, Timeout performed, Emergency Drugs available, Patient being monitored and Suction available Patient Re-evaluated:Patient Re-evaluated prior to inductionOxygen Delivery Method: Circle system utilized Intubation Type: IV induction and Rapid sequence Laryngoscope Size: Mac and 3 Grade View: Grade I Tube type: Oral Tube size: 7.5 mm Number of attempts: 1 Airway Equipment and Method: Stylet Placement Confirmation: ETT inserted through vocal cords under direct vision,  breath sounds checked- equal and bilateral and positive ETCO2 Secured at: 21 cm Tube secured with: Tape Dental Injury: Teeth and Oropharynx as per pre-operative assessment

## 2013-09-04 NOTE — Anesthesia Preprocedure Evaluation (Addendum)
Anesthesia Evaluation  Patient identified by MRN, date of birth, ID band Patient awake    Reviewed: Allergy & Precautions, H&P , NPO status , Patient's Chart, lab work & pertinent test results, reviewed documented beta blocker date and time   History of Anesthesia Complications (+) PONV  Airway Mallampati: II TM Distance: >3 FB Neck ROM: Full    Dental  (+) Teeth Intact, Dental Advisory Given   Pulmonary  breath sounds clear to auscultation        Cardiovascular hypertension, Pt. on medications and Pt. on home beta blockers + Peripheral Vascular Disease Rhythm:Regular Rate:Normal     Neuro/Psych Anxiety CVA, Residual Symptoms    GI/Hepatic   Endo/Other    Renal/GU      Musculoskeletal   Abdominal (+)  Abdomen: soft. Bowel sounds: normal.  Peds  Hematology   Anesthesia Other Findings   Reproductive/Obstetrics                        Anesthesia Physical Anesthesia Plan  ASA: II  Anesthesia Plan: MAC and General   Post-op Pain Management:    Induction: Intravenous  Airway Management Planned: Nasal Cannula and Oral ETT  Additional Equipment:   Intra-op Plan:   Post-operative Plan: Extubation in OR  Informed Consent: I have reviewed the patients History and Physical, chart, labs and discussed the procedure including the risks, benefits and alternatives for the proposed anesthesia with the patient or authorized representative who has indicated his/her understanding and acceptance.   Dental advisory given  Plan Discussed with: CRNA and Surgeon  Anesthesia Plan Comments:        Anesthesia Quick Evaluation

## 2013-09-04 NOTE — Op Note (Signed)
Preop DX: SCS implant, wound dehiscence Postop DX: wound infection PROCEDURES PERFORMED: 1) removal of SCS system 2) irrigation and debridement  SURGEON: Skyelar Halliday ASSISTANT: none  ANESTHESIA: MAC converted to GETA  EBL <50 cc  Findings: log grade indolent, mucoid material in IPG pocket site, Gram Stain GPC in chains  DESCRIPTION OF PROCEDURE: After a discussion of risks, benefits and alternatives, written informed consent was obtained. The patient was taken to the operative suite, positioned on the table prone, pressure points padded, and a timeout taken. Antibiotics were held.  The patient's back was prepped and draped into a sterile field. The patients IPG incision site was infiltrated with 1% lidocaine, and incised. Dissection was carried down to the IPG itself and the IPG delivered onto the field. In the base of the pocket and deep to the IPG was purulent/mucoid material that was swabbed and sent for gram stain and culture. The IPG was disconnected from the leads.  At that point, the patient developed emesis, and the decision was made to turn the patient, and intubate her to protect her airway. A bacitracin-soaked sponge was placed in the pocket, drapes removed and the incision protected with ioban film.   The patient was turned supine, intubated endotrachealy, and the stomach and lungs suctioned to remove any material. There was no return of fluid from the lungs, and clear fluid was aspirated from the stomach. The patient was returned to the supine position on the OR bed, reprepped and draped.   Attention was turned to removal of the leads. The midlumbar lead placement incision was incised and sharp dissection used to localize, and free the leads and their anchors. The leads were bissected proximal to the anchors, and the leads pulled back through the IPG site. In total, all components were removed.   Starting at the lead incision, and then working back to the IPG site, the wounds were  irrigated substantially using a Pulsavac device. A total of 2.5L were used to irrigate and debride the wounds. At the conclusion of irrigation, wounds were inspected and hemostasis obtained.  A stab wound was made in the base of the IPG site, and a small JP drain placed, and fixed to the skin with a 2-0 nylon.   Wounds were closed in layers of 2-0 vicryl, and skin closed with staples. Bacitracin and sterile dressings were applied.  Instrument, needle, and sponge counts were correct x2 at the end of the case.   COMPLICATIONS: see narrative.  CULTURES: pending  DISPOSITION:  Patient wants to go home. She is stable. I will send her home with antibiotics and pain medicine. I have spoken to the patient and her sister about drain care. We will have her return to clinic in about 3-5 days to evaluate her wounds, and her drain.

## 2013-09-07 ENCOUNTER — Encounter (HOSPITAL_COMMUNITY): Payer: Self-pay | Admitting: Anesthesiology

## 2013-09-07 LAB — WOUND CULTURE: Culture: NO GROWTH

## 2013-09-07 NOTE — Anesthesia Postprocedure Evaluation (Signed)
Anesthesia Post Note  Patient: Sierra Leach  Procedure(s) Performed: Procedure(s) (LRB): irrigation and debridement of lumbar wound (N/A)  Anesthesia type: general  Patient location: PACU  Post pain: Pain level controlled  Post assessment: Patient's Cardiovascular Status Stable  Last Vitals:  Filed Vitals:   09/04/13 1620  BP: 139/86  Pulse: 71  Temp: 36.4 C  Resp: 19    Post vital signs: Reviewed and stable  Level of consciousness: sedated  Complications: No apparent anesthesia complications

## 2013-09-08 LAB — GRAM STAIN

## 2013-09-09 LAB — ANAEROBIC CULTURE

## 2013-09-09 LAB — WOUND CULTURE: Special Requests: NORMAL

## 2013-09-14 NOTE — ED Notes (Signed)
Michiel CowboyNancy Stack RN said [facility] called on [employer] and requested another order for the sensitivity report.  I called and spoke to ChadGloria at [facility].  Apparently, the order from Dr. Artis FlockKindl that was faxed by Michiel CowboyNancy Stack RN on 08/14/13 was never received by Malachi BondsGloria. She said they need it for billing.  I told her I would ask Dr. Artis FlockKindl to write another order and fax it to [phone]  "Attention Malachi BondsGloria."  I told her I had received the sensitivity report, so she must have received an order to it.   She said she had the sensitivity report done based on the verbal order she got on the phone on 08/14/13, but never got the fax with the written order.  I told her I had faxed the sensitivity report to Dr. Odette FractionPaul Harkins who was treating the pt. and we are not the primary care for this pt.  Confirmation received for fax. Vassie MoselleYork, Jhovany Weidinger M 09/14/2013

## 2015-01-18 IMAGING — CR DG CHEST 2V
2 series · 2 of 2 positions shown · non-contrast
Comparison: None.

CLINICAL DATA: Preop for I and D, lumbar wound

EXAM:
CHEST  2 VIEW

[w chest pa]
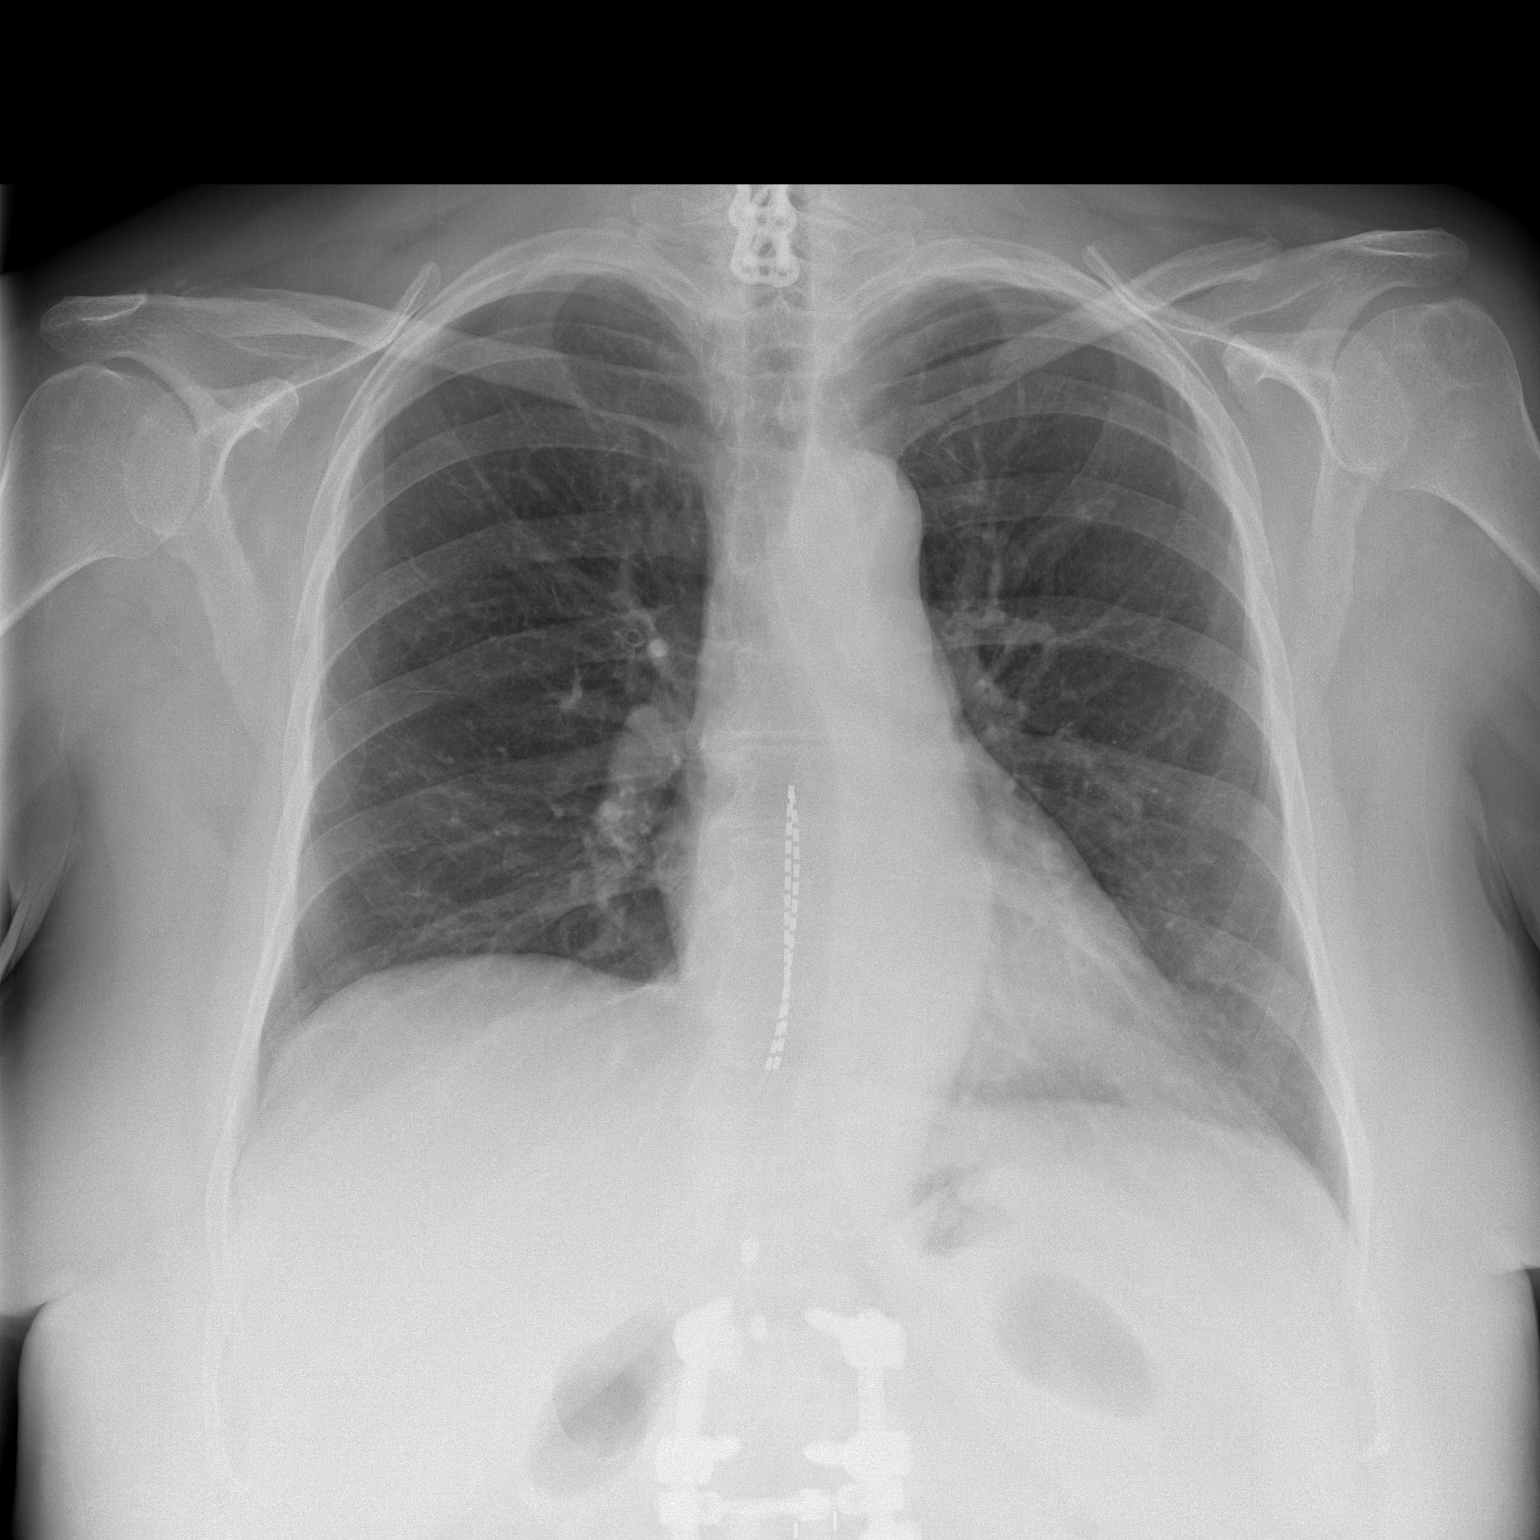

[w chest lat]
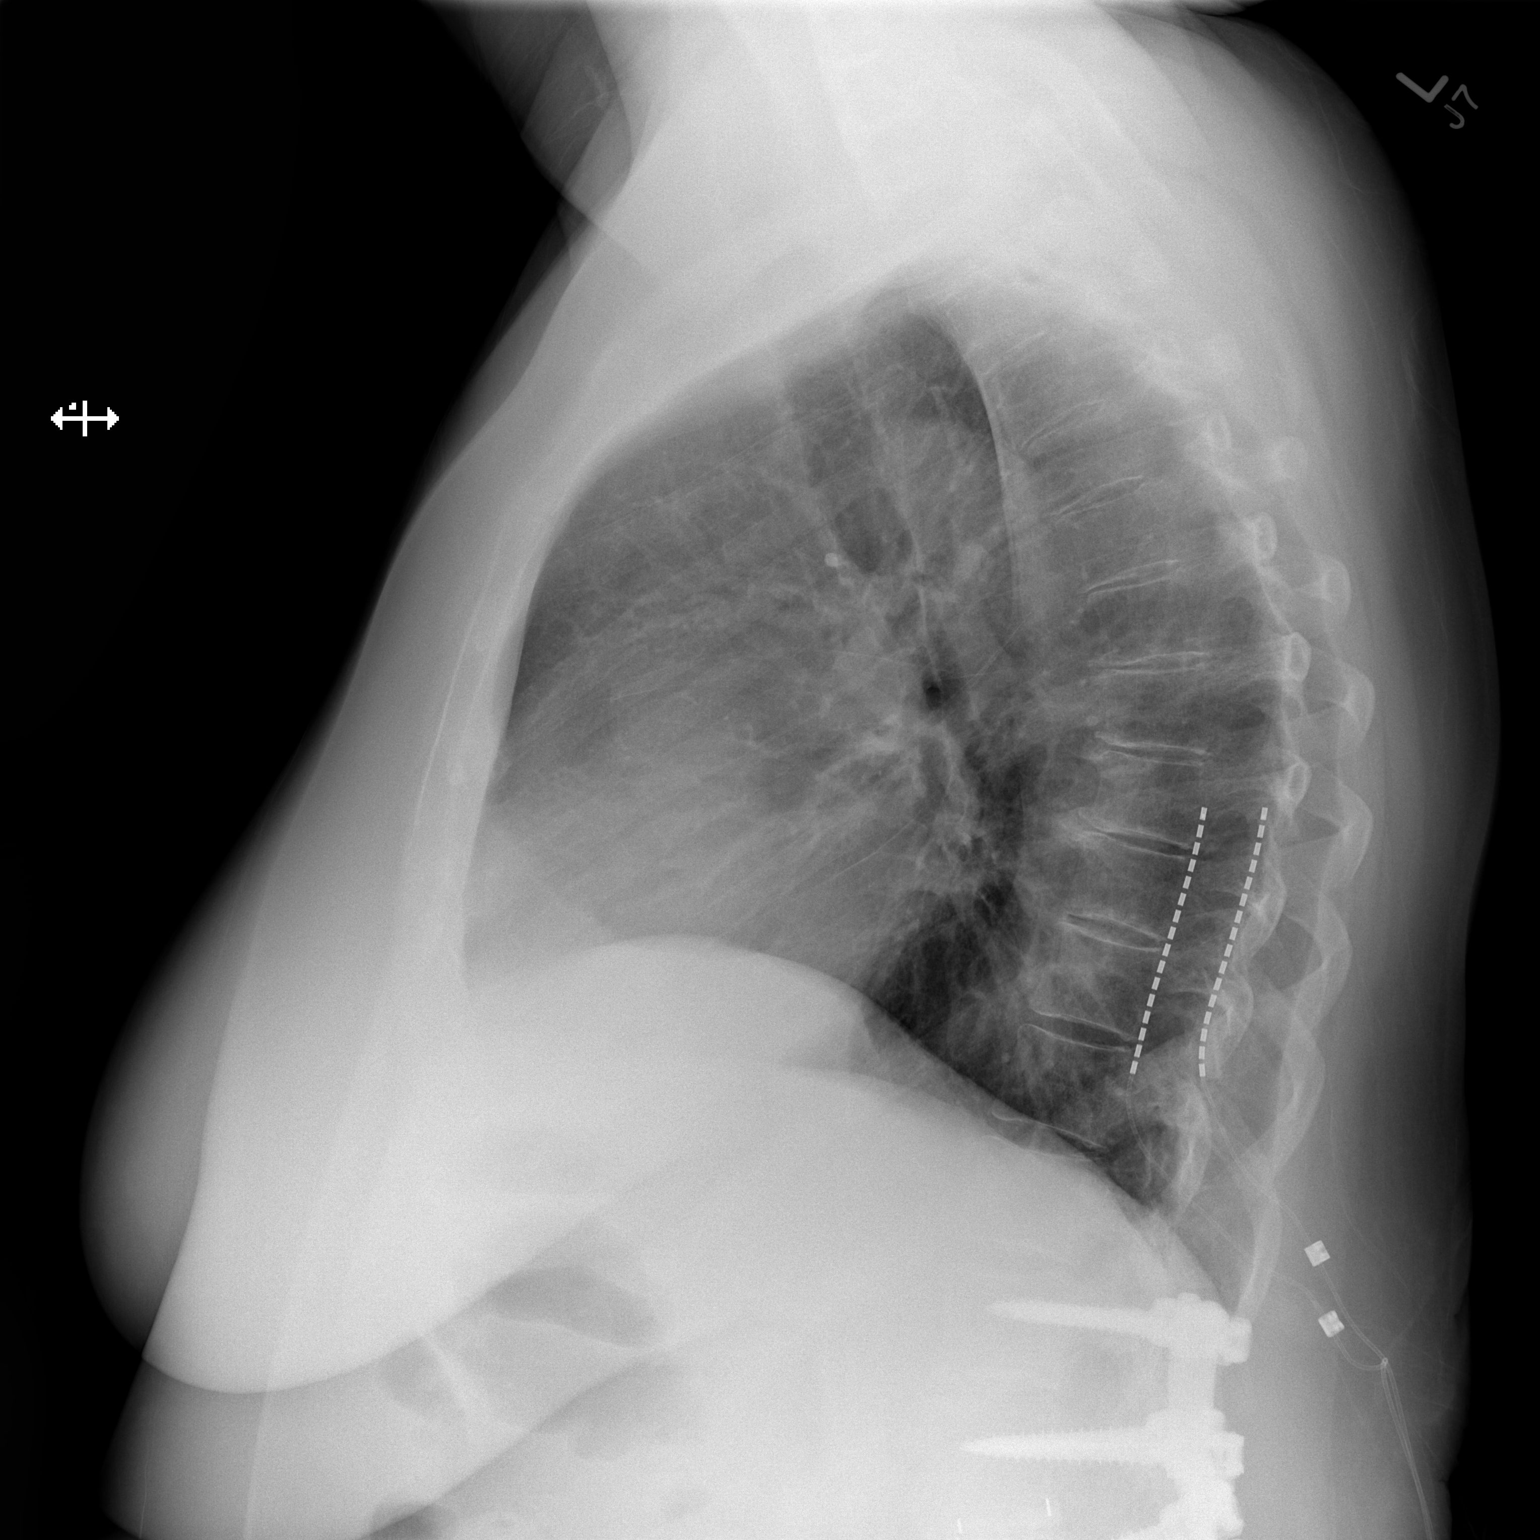

[2 of 2 positions shown; findings below may reference images not displayed]

FINDINGS: The heart size and mediastinal contours are within normal limits.
Both lungs are clear. Spinal stimulation wires are noted mid
thoracic spine. Postsurgical changes lumbar spine. Mild elevation of
the right hemidiaphragm. Metallic fixation plate cervical spine.
IMPRESSION: No active cardiopulmonary disease.

## 2015-02-18 ENCOUNTER — Other Ambulatory Visit: Payer: Self-pay | Admitting: Neurosurgery

## 2015-02-18 DIAGNOSIS — M4724 Other spondylosis with radiculopathy, thoracic region: Secondary | ICD-10-CM

## 2015-04-12 ENCOUNTER — Ambulatory Visit
Admission: RE | Admit: 2015-04-12 | Discharge: 2015-04-12 | Disposition: A | Discharge status: Home / Self Care | Payer: Medicare HMO | Source: Ambulatory Visit | Attending: Neurosurgery | Admitting: Neurosurgery

## 2015-04-12 DIAGNOSIS — M4724 Other spondylosis with radiculopathy, thoracic region: Secondary | ICD-10-CM

## 2015-04-12 MED ORDER — TRIAMCINOLONE ACETONIDE 40 MG/ML IJ SUSP (RADIOLOGY)
60.0000 mg | Freq: Once | INTRAMUSCULAR | Status: AC
  Administered 2015-04-12: 60 mg via EPIDURAL

## 2015-04-12 MED ORDER — IOHEXOL 300 MG/ML  SOLN
1.0000 mL | Freq: Once | INTRAMUSCULAR | Status: DC | PRN
  Administered 2015-04-12: 1 mL via EPIDURAL

## 2015-04-12 NOTE — Progress Notes (Signed)
Dr. Alfredo Batty in to speak with patient after injection in nursing station secondary to brief drop in blood pressure (vasovagal response).  jkl

## 2015-07-18 DIAGNOSIS — G8929 Other chronic pain: Secondary | ICD-10-CM | POA: Insufficient documentation

## 2015-07-18 DIAGNOSIS — N951 Menopausal and female climacteric states: Secondary | ICD-10-CM | POA: Insufficient documentation

## 2015-07-18 DIAGNOSIS — F419 Anxiety disorder, unspecified: Secondary | ICD-10-CM | POA: Insufficient documentation

## 2015-07-18 DIAGNOSIS — M545 Low back pain, unspecified: Secondary | ICD-10-CM | POA: Insufficient documentation

## 2015-07-18 DIAGNOSIS — Z79899 Other long term (current) drug therapy: Secondary | ICD-10-CM | POA: Insufficient documentation

## 2015-07-18 DIAGNOSIS — K219 Gastro-esophageal reflux disease without esophagitis: Secondary | ICD-10-CM | POA: Insufficient documentation

## 2015-07-18 DIAGNOSIS — E7800 Pure hypercholesterolemia, unspecified: Secondary | ICD-10-CM | POA: Insufficient documentation

## 2015-07-18 DIAGNOSIS — E559 Vitamin D deficiency, unspecified: Secondary | ICD-10-CM | POA: Insufficient documentation

## 2015-07-18 DIAGNOSIS — M8580 Other specified disorders of bone density and structure, unspecified site: Secondary | ICD-10-CM | POA: Insufficient documentation

## 2015-07-18 DIAGNOSIS — M4800 Spinal stenosis, site unspecified: Secondary | ICD-10-CM | POA: Insufficient documentation

## 2015-07-18 DIAGNOSIS — E669 Obesity, unspecified: Secondary | ICD-10-CM | POA: Insufficient documentation

## 2015-07-18 DIAGNOSIS — M1A9XX0 Chronic gout, unspecified, without tophus (tophi): Secondary | ICD-10-CM | POA: Insufficient documentation

## 2015-07-18 DIAGNOSIS — I10 Essential (primary) hypertension: Secondary | ICD-10-CM | POA: Insufficient documentation

## 2015-07-18 DIAGNOSIS — M1A00X0 Idiopathic chronic gout, unspecified site, without tophus (tophi): Secondary | ICD-10-CM | POA: Insufficient documentation

## 2015-07-18 DIAGNOSIS — Z86711 Personal history of pulmonary embolism: Secondary | ICD-10-CM | POA: Insufficient documentation

## 2015-07-19 DIAGNOSIS — L821 Other seborrheic keratosis: Secondary | ICD-10-CM | POA: Insufficient documentation

## 2015-07-19 DIAGNOSIS — M546 Pain in thoracic spine: Secondary | ICD-10-CM | POA: Insufficient documentation

## 2015-10-21 ENCOUNTER — Other Ambulatory Visit: Payer: Self-pay | Admitting: Neurosurgery

## 2015-10-21 DIAGNOSIS — M4724 Other spondylosis with radiculopathy, thoracic region: Secondary | ICD-10-CM

## 2015-10-27 ENCOUNTER — Ambulatory Visit
Admission: RE | Admit: 2015-10-27 | Discharge: 2015-10-27 | Disposition: A | Discharge status: Home / Self Care | Payer: Medicare HMO | Source: Ambulatory Visit | Attending: Neurosurgery | Admitting: Neurosurgery

## 2015-10-27 DIAGNOSIS — M4724 Other spondylosis with radiculopathy, thoracic region: Secondary | ICD-10-CM

## 2015-10-27 MED ORDER — IOHEXOL 300 MG/ML  SOLN
1.0000 mL | Freq: Once | INTRAMUSCULAR | Status: AC | PRN
  Administered 2015-10-27: 1 mL via EPIDURAL

## 2015-10-27 MED ORDER — TRIAMCINOLONE ACETONIDE 40 MG/ML IJ SUSP (RADIOLOGY)
60.0000 mg | Freq: Once | INTRAMUSCULAR | Status: AC
  Administered 2015-10-27: 60 mg via EPIDURAL

## 2015-10-27 NOTE — Discharge Instructions (Signed)

## 2015-11-08 ENCOUNTER — Other Ambulatory Visit: Payer: Self-pay | Admitting: Neurosurgery

## 2015-11-08 DIAGNOSIS — M4724 Other spondylosis with radiculopathy, thoracic region: Secondary | ICD-10-CM

## 2015-11-21 ENCOUNTER — Other Ambulatory Visit: Payer: Medicare HMO

## 2015-11-27 DIAGNOSIS — Z7901 Long term (current) use of anticoagulants: Secondary | ICD-10-CM | POA: Insufficient documentation

## 2015-12-01 ENCOUNTER — Inpatient Hospital Stay: Admission: RE | Admit: 2015-12-01 | Payer: Medicare HMO | Source: Ambulatory Visit

## 2015-12-01 ENCOUNTER — Encounter: Payer: Self-pay | Admitting: Radiology

## 2015-12-12 ENCOUNTER — Other Ambulatory Visit: Payer: Self-pay | Admitting: Neurosurgery

## 2015-12-12 DIAGNOSIS — M542 Cervicalgia: Secondary | ICD-10-CM

## 2015-12-20 ENCOUNTER — Other Ambulatory Visit: Payer: Self-pay

## 2015-12-20 ENCOUNTER — Ambulatory Visit
Admission: RE | Admit: 2015-12-20 | Discharge: 2015-12-20 | Disposition: A | Discharge status: Home / Self Care | Payer: Medicare HMO | Source: Ambulatory Visit | Attending: Neurosurgery | Admitting: Neurosurgery

## 2015-12-20 DIAGNOSIS — M542 Cervicalgia: Secondary | ICD-10-CM

## 2015-12-20 MED ORDER — TRIAMCINOLONE ACETONIDE 40 MG/ML IJ SUSP (RADIOLOGY)
60.0000 mg | Freq: Once | INTRAMUSCULAR | Status: AC
  Administered 2015-12-20: 60 mg via EPIDURAL

## 2015-12-20 MED ORDER — IOPAMIDOL (ISOVUE-M 300) INJECTION 61%
1.0000 mL | Freq: Once | INTRAMUSCULAR | Status: AC | PRN
  Administered 2015-12-20: 1 mL via EPIDURAL

## 2015-12-22 ENCOUNTER — Telehealth: Payer: Self-pay | Admitting: Radiology

## 2015-12-22 NOTE — Telephone Encounter (Signed)
Pt called because she had started itching at 2 am this morning. She was not sure of the cause, told her benadryl otc would probably take care of it and that zertec was another that should work. Pt had an injection 2 days ago.

## 2016-01-11 ENCOUNTER — Other Ambulatory Visit: Payer: Self-pay | Admitting: Neurosurgery

## 2016-01-30 ENCOUNTER — Encounter (HOSPITAL_COMMUNITY)
Admission: RE | Admit: 2016-01-30 | Discharge: 2016-01-30 | Disposition: A | Discharge status: Home / Self Care | Payer: Medicare HMO | Source: Ambulatory Visit | Attending: Neurosurgery | Admitting: Neurosurgery

## 2016-01-30 ENCOUNTER — Encounter (HOSPITAL_COMMUNITY): Payer: Self-pay

## 2016-01-30 DIAGNOSIS — Z79899 Other long term (current) drug therapy: Secondary | ICD-10-CM | POA: Diagnosis not present

## 2016-01-30 DIAGNOSIS — Z86711 Personal history of pulmonary embolism: Secondary | ICD-10-CM | POA: Insufficient documentation

## 2016-01-30 DIAGNOSIS — Z01812 Encounter for preprocedural laboratory examination: Secondary | ICD-10-CM | POA: Insufficient documentation

## 2016-01-30 DIAGNOSIS — Z01818 Encounter for other preprocedural examination: Secondary | ICD-10-CM | POA: Insufficient documentation

## 2016-01-30 DIAGNOSIS — K219 Gastro-esophageal reflux disease without esophagitis: Secondary | ICD-10-CM | POA: Insufficient documentation

## 2016-01-30 DIAGNOSIS — F419 Anxiety disorder, unspecified: Secondary | ICD-10-CM | POA: Insufficient documentation

## 2016-01-30 DIAGNOSIS — M1990 Unspecified osteoarthritis, unspecified site: Secondary | ICD-10-CM | POA: Diagnosis not present

## 2016-01-30 DIAGNOSIS — Z7901 Long term (current) use of anticoagulants: Secondary | ICD-10-CM | POA: Diagnosis not present

## 2016-01-30 DIAGNOSIS — I10 Essential (primary) hypertension: Secondary | ICD-10-CM | POA: Insufficient documentation

## 2016-01-30 DIAGNOSIS — R001 Bradycardia, unspecified: Secondary | ICD-10-CM | POA: Diagnosis not present

## 2016-01-30 HISTORY — DX: Unspecified osteoarthritis, unspecified site: M19.90

## 2016-01-30 LAB — CBC
HCT: 44.2 % (ref 36.0–46.0)
Hemoglobin: 14.5 g/dL (ref 12.0–15.0)
MCH: 30.6 pg (ref 26.0–34.0)
MCHC: 32.8 g/dL (ref 30.0–36.0)
MCV: 93.2 fL (ref 78.0–100.0)
PLATELETS: 214 10*3/uL (ref 150–400)
RBC: 4.74 MIL/uL (ref 3.87–5.11)
RDW: 13.4 % (ref 11.5–15.5)
WBC: 6 10*3/uL (ref 4.0–10.5)

## 2016-01-30 LAB — BASIC METABOLIC PANEL
ANION GAP: 7 (ref 5–15)
BUN: 16 mg/dL (ref 6–20)
CALCIUM: 9.3 mg/dL (ref 8.9–10.3)
CO2: 25 mmol/L (ref 22–32)
CREATININE: 1.01 mg/dL — AB (ref 0.44–1.00)
Chloride: 105 mmol/L (ref 101–111)
GFR, EST NON AFRICAN AMERICAN: 58 mL/min — AB (ref >60)
Glucose, Bld: 98 mg/dL (ref 65–99)
Potassium: 4.1 mmol/L (ref 3.5–5.1)
Sodium: 137 mmol/L (ref 135–145)

## 2016-01-30 LAB — SURGICAL PCR SCREEN
MRSA, PCR: POSITIVE — AB
Staphylococcus aureus: POSITIVE — AB

## 2016-01-30 LAB — PROTIME-INR
INR: 1.5 — AB (ref 0.00–1.49)
PROTHROMBIN TIME: 18.2 s — AB (ref 11.6–15.2)

## 2016-01-30 LAB — APTT: aPTT: 27 seconds (ref 24–37)

## 2016-01-30 MED ORDER — CHLORHEXIDINE GLUCONATE CLOTH 2 % EX PADS: 6.0000 | MEDICATED_PAD | Freq: Once | CUTANEOUS | Status: DC

## 2016-01-30 MED ORDER — CHLORHEXIDINE GLUCONATE CLOTH 2 % EX PADS
6.0000 | MEDICATED_PAD | Freq: Once | CUTANEOUS | Status: DC
Start: 2016-01-30 — End: 2016-02-01

## 2016-01-30 NOTE — Pre-Procedure Instructions (Signed)
Sierra Leach  01/30/2016      [facility] - [place], [place] - [address] AT [facility] [address] [place] [address] Phone: [phone] Fax: [phone]    Your procedure is scheduled on 02/06/16.  Report to [facility] Admitting at 530 A.M.  Call this number if you have problems the morning of surgery:  [phone]   Remember:  Do not eat food or drink liquids after midnight.  Take these medicines the morning of surgery with A SIP OF WATER --tylenol,allopurional,carvedilol,celexa,valium,oxy ir   Do not wear jewelry, make-up or nail polish.  Do not wear lotions, powders, or perfumes.  You may wear deoderant.  Do not shave 48 hours prior to surgery.  Men may shave face and neck.  Do not bring valuables to the hospital.  [facility] is not responsible for any belongings or valuables.  Contacts, dentures or bridgework may not be worn into surgery.  Leave your suitcase in the car.  After surgery it may be brought to your room.  For patients admitted to the hospital, discharge time will be determined by your treatment team.  Patients discharged the day of surgery will not be allowed to drive home.   Name and phone number of your driver:   Special instructions: Do not take any aspirin,anti-inflammatories,vitamins,or herbal supplements 5-7 days prior to surgery.  Please read over the following fact sheets that you were given. MRSA Information [facility] - Preparing for Surgery  Before surgery, you can play an important role.  Because skin is not sterile, your skin needs to be as free of germs as possible.  You can reduce the number of germs on you skin by washing with CHG (chlorahexidine gluconate) soap before surgery.  CHG is an antiseptic cleaner which kills germs and bonds with the skin to continue killing germs even after washing.  Please DO NOT use if you have an allergy to CHG or antibacterial soaps.  If your skin  becomes reddened/irritated stop using the CHG and inform your nurse when you arrive at Short Stay.  Do not shave (including legs and underarms) for at least 48 hours prior to the first CHG shower.  You may shave your face.  Please follow these instructions carefully:   1.  Shower with CHG Soap the night before surgery and the                                morning of Surgery.  2.  If you choose to wash your hair, wash your hair first as usual with your       normal shampoo.  3.  After you shampoo, rinse your hair and body thoroughly to remove the                      Shampoo.  4.  Use CHG as you would any other liquid soap.  You can apply chg directly       to the skin and wash gently with scrungie or a clean washcloth.  5.  Apply the CHG Soap to your body ONLY FROM THE NECK DOWN.        Do not use on open wounds or open sores.  Avoid contact with your eyes,       ears, mouth and genitals (private parts).  Wash  genitals (private parts)       with your normal soap.  6.  Wash thoroughly, paying special attention to the area where your surgery        will be performed.  7.  Thoroughly rinse your body with warm water from the neck down.  8.  DO NOT shower/wash with your normal soap after using and rinsing off       the CHG Soap.  9.  Pat yourself dry with a clean towel.            10.  Wear clean pajamas.            11.  Place clean sheets on your bed the night of your first shower and do not        sleep with pets.  Day of Surgery  Do not apply any lotions/deoderants the morning of surgery.  Please wear clean clothes to the hospital/surgery center.

## 2016-01-30 NOTE — Progress Notes (Signed)
Mupirocin Ointment Rx called into [facility] on [address] in [facility] for positive PCR of MRSA and Staph. Pt notified and voiced understanding.

## 2016-02-01 NOTE — Progress Notes (Signed)
Anesthesia Chart Review: Patient is a 65 year old female scheduled for C3-4 ACDF, removal C4-7 on 02/06/16 by Dr. Wynetta Emery.  History includes non-smoker, post-operative N/V, HTN, CVA with left leg weakness '99, PE '13 (on warfarin), GERD, SOB, anxiety, arthritis, hysterectomy, nephrolithiasis, left lumpectomy, multiple back surgeries, shoulder arthroscopyy. BMI is 39.84 consistent with obesity/borderline morbid obesity. Patient was seen at [facility] on 12/28/15 for weakness, dizziness, sweaty, SOB and generally feeling ill. No chest pain. Felt similar to when she had a PE three years prior. Had recently had injection to her neck. INR was subtherapeutic. CTA was negative for PE. Cardiac panel X 1 was negative. By notes, EKG showed SR with non-specific T wave abnormality. She was discharged home with diagnoses of cervicalgia and weakness.  PCP is Dr. Tarri Fuller.  Meds include allopurinol, Coreg, Celexa, Valium, Oxy-IR, Zanaflex, warfarin (on hold starting 01/30/16).   PAT Vitals: BP 139/85, HR 62, RR 20, T 36.5C, O2 sat 95%.  01/30/16 EKG: SB at 51 bpm, T wave abnormality, consider lateral ischemia. T wave abnormality has been present on tracings dating back to at least 07/28/07. T wave abnormality is more pronounced when compared to September 22, 2013 tracing but more similar to 2011 tracing. (Multiple EKGs printed from [facility] and placed on chart.)   12/28/15 CTA Chest (Care Everywhere): IMPRESSION: 1. Negative.No acute PE or thoracic aortic dissection.  Preoperative labs noted. PT 18.2, INR 1.50. PTT 27. Will recheck PT/INR on the DOS.  Discussed above with anesthesiologist Dr. Hart Rochester. EKGs overall stable except HR slower. She is on a b-blocker. No CP symptoms reported at PAT. If no acute changes or new cardiopulmonary symptoms then it is anticipated that she can proceed as planned. Further evaluation by her anesthesiologist on the day of surgery.  Velna Ochs [facility] Short Stay  Center/Anesthesiology Phone [phone] 02/01/2016 6:10 PM

## 2016-02-03 MED ORDER — VANCOMYCIN HCL 10 G IV SOLR
1500.0000 mg | INTRAVENOUS | Status: AC
  Administered 2016-02-06: 1500 mg via INTRAVENOUS
  Filled 2016-02-03: qty 1500

## 2016-02-05 ENCOUNTER — Encounter (HOSPITAL_COMMUNITY): Payer: Self-pay | Admitting: Anesthesiology

## 2016-02-05 NOTE — Anesthesia Preprocedure Evaluation (Addendum)
Anesthesia Evaluation  Patient identified by MRN, date of birth, ID band Patient awake    Reviewed: NPO status , Patient's Chart, lab work & pertinent test results, reviewed documented beta blocker date and time   Airway Mallampati: I       Dental no notable dental hx.    Pulmonary    Pulmonary exam normal        Cardiovascular hypertension, Pt. on home beta blockers Normal cardiovascular exam     Neuro/Psych    GI/Hepatic   Endo/Other  Morbid obesity  Renal/GU      Musculoskeletal   Abdominal Normal abdominal exam  (+)   Peds  Hematology   Anesthesia Other Findings   Reproductive/Obstetrics                            Anesthesia Physical Anesthesia Plan  ASA: III  Anesthesia Plan: General   Post-op Pain Management:    Induction: Intravenous  Airway Management Planned: Oral ETT  Additional Equipment:   Intra-op Plan:   Post-operative Plan: Extubation in OR  Informed Consent: I have reviewed the patients History and Physical, chart, labs and discussed the procedure including the risks, benefits and alternatives for the proposed anesthesia with the patient or authorized representative who has indicated his/her understanding and acceptance.   Dental advisory given  Plan Discussed with: CRNA and Surgeon  Anesthesia Plan Comments:        Anesthesia Quick Evaluation

## 2016-02-06 ENCOUNTER — Inpatient Hospital Stay (HOSPITAL_COMMUNITY): Payer: Medicare HMO

## 2016-02-06 ENCOUNTER — Encounter (HOSPITAL_COMMUNITY)
Admission: RE | Disposition: A | Discharge status: Home / Self Care | Payer: Self-pay | Source: Ambulatory Visit | Attending: Neurosurgery

## 2016-02-06 ENCOUNTER — Inpatient Hospital Stay (HOSPITAL_COMMUNITY): Payer: Medicare HMO | Admitting: Vascular Surgery

## 2016-02-06 ENCOUNTER — Inpatient Hospital Stay (HOSPITAL_COMMUNITY)
Admission: RE | Admit: 2016-02-06 | Discharge: 2016-02-07 | DRG: 473 | Disposition: A | Discharge status: Home / Self Care | Payer: Medicare HMO | Source: Ambulatory Visit | Attending: Neurosurgery | Admitting: Neurosurgery

## 2016-02-06 ENCOUNTER — Encounter (HOSPITAL_COMMUNITY): Payer: Self-pay | Admitting: *Deleted

## 2016-02-06 DIAGNOSIS — Z882 Allergy status to sulfonamides status: Secondary | ICD-10-CM | POA: Diagnosis not present

## 2016-02-06 DIAGNOSIS — Z888 Allergy status to other drugs, medicaments and biological substances status: Secondary | ICD-10-CM | POA: Diagnosis not present

## 2016-02-06 DIAGNOSIS — Z419 Encounter for procedure for purposes other than remedying health state, unspecified: Secondary | ICD-10-CM

## 2016-02-06 DIAGNOSIS — Z8673 Personal history of transient ischemic attack (TIA), and cerebral infarction without residual deficits: Secondary | ICD-10-CM

## 2016-02-06 DIAGNOSIS — Z981 Arthrodesis status: Secondary | ICD-10-CM

## 2016-02-06 DIAGNOSIS — M542 Cervicalgia: Secondary | ICD-10-CM | POA: Diagnosis present

## 2016-02-06 DIAGNOSIS — Z880 Allergy status to penicillin: Secondary | ICD-10-CM | POA: Diagnosis not present

## 2016-02-06 DIAGNOSIS — M25511 Pain in right shoulder: Secondary | ICD-10-CM | POA: Diagnosis present

## 2016-02-06 DIAGNOSIS — Z7901 Long term (current) use of anticoagulants: Secondary | ICD-10-CM | POA: Diagnosis not present

## 2016-02-06 DIAGNOSIS — M4802 Spinal stenosis, cervical region: Secondary | ICD-10-CM | POA: Diagnosis present

## 2016-02-06 DIAGNOSIS — Z79899 Other long term (current) drug therapy: Secondary | ICD-10-CM | POA: Diagnosis not present

## 2016-02-06 DIAGNOSIS — I10 Essential (primary) hypertension: Secondary | ICD-10-CM | POA: Diagnosis present

## 2016-02-06 DIAGNOSIS — M47812 Spondylosis without myelopathy or radiculopathy, cervical region: Secondary | ICD-10-CM | POA: Diagnosis present

## 2016-02-06 HISTORY — PX: ANTERIOR CERVICAL DECOMP/DISCECTOMY FUSION: SHX1161

## 2016-02-06 LAB — PROTIME-INR
INR: 1.04
PROTHROMBIN TIME: 13.7 s (ref 11.4–15.2)

## 2016-02-06 SURGERY — ANTERIOR CERVICAL DECOMPRESSION/DISCECTOMY FUSION 1 LEVEL/HARDWARE REMOVAL
Anesthesia: General | Site: Spine Cervical

## 2016-02-06 MED ORDER — CYCLOBENZAPRINE HCL 10 MG PO TABS
10.0000 mg | ORAL_TABLET | Freq: Three times a day (TID) | ORAL | Status: DC | PRN
  Administered 2016-02-06 – 2016-02-07 (×3): 10 mg via ORAL
  Filled 2016-02-06 (×3): qty 1

## 2016-02-06 MED ORDER — ACETAMINOPHEN 650 MG RE SUPP: 650.0000 mg | RECTAL | Status: DC | PRN

## 2016-02-06 MED ORDER — LIDOCAINE 2% (20 MG/ML) 5 ML SYRINGE
INTRAMUSCULAR | Status: AC
  Filled 2016-02-06: qty 5

## 2016-02-06 MED ORDER — HYDROMORPHONE HCL 1 MG/ML IJ SOLN
0.5000 mg | INTRAMUSCULAR | Status: DC | PRN
  Administered 2016-02-06: 1 mg via INTRAVENOUS
  Filled 2016-02-06: qty 1

## 2016-02-06 MED ORDER — WARFARIN - PHYSICIAN DOSING INPATIENT: Freq: Every day | Status: DC

## 2016-02-06 MED ORDER — SODIUM CHLORIDE 0.9% FLUSH: 3.0000 mL | INTRAVENOUS | Status: DC | PRN

## 2016-02-06 MED ORDER — ONDANSETRON HCL 4 MG/2ML IJ SOLN
INTRAMUSCULAR | Status: AC
  Filled 2016-02-06: qty 2

## 2016-02-06 MED ORDER — PHENYLEPHRINE HCL 10 MG/ML IJ SOLN
INTRAMUSCULAR | Status: DC | PRN
  Administered 2016-02-06: 25 ug/min via INTRAVENOUS

## 2016-02-06 MED ORDER — THROMBIN 5000 UNITS EX SOLR
CUTANEOUS | Status: DC | PRN
  Administered 2016-02-06 (×2): 5000 [IU] via TOPICAL

## 2016-02-06 MED ORDER — PROPOFOL 10 MG/ML IV BOLUS
INTRAVENOUS | Status: AC
  Filled 2016-02-06: qty 20

## 2016-02-06 MED ORDER — KETOROLAC TROMETHAMINE 30 MG/ML IJ SOLN: 30.0000 mg | Freq: Once | INTRAMUSCULAR | Status: DC

## 2016-02-06 MED ORDER — ONDANSETRON HCL 4 MG/2ML IJ SOLN
INTRAMUSCULAR | Status: DC | PRN
  Administered 2016-02-06: 4 mg via INTRAVENOUS

## 2016-02-06 MED ORDER — CEFAZOLIN SODIUM-DEXTROSE 2-4 GM/100ML-% IV SOLN: 2.0000 g | Freq: Three times a day (TID) | INTRAVENOUS | Status: DC

## 2016-02-06 MED ORDER — ROCURONIUM BROMIDE 50 MG/5ML IV SOLN
INTRAVENOUS | Status: AC
  Filled 2016-02-06: qty 1

## 2016-02-06 MED ORDER — PROMETHAZINE HCL 25 MG/ML IJ SOLN
INTRAMUSCULAR | Status: AC
  Filled 2016-02-06: qty 1

## 2016-02-06 MED ORDER — OXYCODONE-ACETAMINOPHEN 5-325 MG PO TABS
1.0000 | ORAL_TABLET | ORAL | Status: DC | PRN
  Administered 2016-02-06 – 2016-02-07 (×4): 2 via ORAL
  Filled 2016-02-06 (×4): qty 2

## 2016-02-06 MED ORDER — MENTHOL 3 MG MT LOZG
1.0000 | LOZENGE | OROMUCOSAL | Status: DC | PRN
Start: 2016-02-06 — End: 2016-02-07

## 2016-02-06 MED ORDER — LACTATED RINGERS IV SOLN
INTRAVENOUS | Status: DC | PRN
  Administered 2016-02-06: 07:00:00 via INTRAVENOUS

## 2016-02-06 MED ORDER — FENTANYL CITRATE (PF) 250 MCG/5ML IJ SOLN
INTRAMUSCULAR | Status: AC
  Filled 2016-02-06: qty 5

## 2016-02-06 MED ORDER — SODIUM CHLORIDE 0.9% FLUSH
3.0000 mL | Freq: Two times a day (BID) | INTRAVENOUS | Status: DC
  Administered 2016-02-06: 3 mL via INTRAVENOUS

## 2016-02-06 MED ORDER — DIAZEPAM 5 MG PO TABS: 5.0000 mg | ORAL_TABLET | Freq: Four times a day (QID) | ORAL | Status: DC | PRN

## 2016-02-06 MED ORDER — EPHEDRINE SULFATE 50 MG/ML IJ SOLN
INTRAMUSCULAR | Status: DC | PRN
  Administered 2016-02-06: 10 mg via INTRAVENOUS

## 2016-02-06 MED ORDER — VANCOMYCIN HCL IN DEXTROSE 1-5 GM/200ML-% IV SOLN
1000.0000 mg | Freq: Two times a day (BID) | INTRAVENOUS | Status: AC
  Administered 2016-02-06 – 2016-02-07 (×2): 1000 mg via INTRAVENOUS
  Filled 2016-02-06 (×2): qty 200

## 2016-02-06 MED ORDER — PHENYLEPHRINE HCL 10 MG/ML IJ SOLN
INTRAMUSCULAR | Status: DC | PRN
  Administered 2016-02-06 (×2): 80 ug via INTRAVENOUS

## 2016-02-06 MED ORDER — CARVEDILOL 12.5 MG PO TABS
12.5000 mg | ORAL_TABLET | ORAL | Status: AC
  Administered 2016-02-06: 12.5 mg via ORAL
  Filled 2016-02-06 (×3): qty 1

## 2016-02-06 MED ORDER — DEXAMETHASONE SODIUM PHOSPHATE 10 MG/ML IJ SOLN
10.0000 mg | INTRAMUSCULAR | Status: AC
  Administered 2016-02-06: 10 mg via INTRAVENOUS
  Filled 2016-02-06: qty 1

## 2016-02-06 MED ORDER — 0.9 % SODIUM CHLORIDE (POUR BTL) OPTIME
TOPICAL | Status: DC | PRN
  Administered 2016-02-06: 1000 mL

## 2016-02-06 MED ORDER — CITALOPRAM HYDROBROMIDE 10 MG PO TABS
10.0000 mg | ORAL_TABLET | Freq: Every day | ORAL | Status: DC
  Filled 2016-02-06: qty 1

## 2016-02-06 MED ORDER — PHENOL 1.4 % MT LIQD
1.0000 | OROMUCOSAL | Status: DC | PRN
Start: 2016-02-06 — End: 2016-02-07

## 2016-02-06 MED ORDER — TIZANIDINE HCL 4 MG PO TABS
2.0000 mg | ORAL_TABLET | Freq: Every day | ORAL | Status: DC | PRN
  Filled 2016-02-06: qty 1

## 2016-02-06 MED ORDER — ONDANSETRON HCL 4 MG/2ML IJ SOLN: 4.0000 mg | INTRAMUSCULAR | Status: DC | PRN

## 2016-02-06 MED ORDER — ROCURONIUM BROMIDE 100 MG/10ML IV SOLN
INTRAVENOUS | Status: DC | PRN
  Administered 2016-02-06: 10 mg via INTRAVENOUS
  Administered 2016-02-06: 40 mg via INTRAVENOUS

## 2016-02-06 MED ORDER — OXYCODONE HCL 5 MG PO TABS: 5.0000 mg | ORAL_TABLET | Freq: Four times a day (QID) | ORAL | Status: DC | PRN

## 2016-02-06 MED ORDER — ALLOPURINOL 100 MG PO TABS
100.0000 mg | ORAL_TABLET | Freq: Every day | ORAL | Status: DC
  Filled 2016-02-06: qty 1

## 2016-02-06 MED ORDER — CARVEDILOL PHOSPHATE ER 10 MG PO CP24: 12.5000 mg | ORAL_CAPSULE | Freq: Once | ORAL | Status: DC

## 2016-02-06 MED ORDER — SODIUM CHLORIDE 0.9 % IV SOLN: 250.0000 mL | INTRAVENOUS | Status: DC

## 2016-02-06 MED ORDER — TIZANIDINE HCL 2 MG PO TABS
2.0000 mg | ORAL_TABLET | Freq: Every day | ORAL | Status: DC
  Administered 2016-02-06: 2 mg via ORAL
  Filled 2016-02-06: qty 1

## 2016-02-06 MED ORDER — CARVEDILOL 6.25 MG PO TABS
12.5000 mg | ORAL_TABLET | Freq: Two times a day (BID) | ORAL | Status: DC
  Administered 2016-02-06: 12.5 mg via ORAL
  Filled 2016-02-06: qty 2

## 2016-02-06 MED ORDER — GLYCOPYRROLATE 0.2 MG/ML IJ SOLN
INTRAMUSCULAR | Status: DC | PRN
  Administered 2016-02-06: 0.6 mg via INTRAVENOUS

## 2016-02-06 MED ORDER — MIDAZOLAM HCL 2 MG/2ML IJ SOLN
INTRAMUSCULAR | Status: AC
  Filled 2016-02-06: qty 2

## 2016-02-06 MED ORDER — ACETAMINOPHEN 325 MG PO TABS
650.0000 mg | ORAL_TABLET | ORAL | Status: DC | PRN
  Filled 2016-02-06: qty 2

## 2016-02-06 MED ORDER — MEPERIDINE HCL 25 MG/ML IJ SOLN: 6.2500 mg | INTRAMUSCULAR | Status: DC | PRN

## 2016-02-06 MED ORDER — PROPOFOL 10 MG/ML IV BOLUS
INTRAVENOUS | Status: DC | PRN
  Administered 2016-02-06: 150 mg via INTRAVENOUS

## 2016-02-06 MED ORDER — THROMBIN 5000 UNITS EX SOLR
OROMUCOSAL | Status: DC | PRN
  Administered 2016-02-06 (×2): 5 mL via TOPICAL

## 2016-02-06 MED ORDER — LIDOCAINE HCL (CARDIAC) 20 MG/ML IV SOLN
INTRAVENOUS | Status: DC | PRN
  Administered 2016-02-06: 80 mg via INTRATRACHEAL
  Administered 2016-02-06: 100 mg via INTRAVENOUS

## 2016-02-06 MED ORDER — PROMETHAZINE HCL 25 MG/ML IJ SOLN
6.2500 mg | INTRAMUSCULAR | Status: DC | PRN
  Administered 2016-02-06: 6.25 mg via INTRAVENOUS

## 2016-02-06 MED ORDER — HEMOSTATIC AGENTS (NO CHARGE) OPTIME
TOPICAL | Status: DC | PRN
  Administered 2016-02-06: 1 via TOPICAL

## 2016-02-06 MED ORDER — NEOSTIGMINE METHYLSULFATE 10 MG/10ML IV SOLN
INTRAVENOUS | Status: DC | PRN
  Administered 2016-02-06: 4 mg via INTRAVENOUS

## 2016-02-06 MED ORDER — FENTANYL CITRATE (PF) 100 MCG/2ML IJ SOLN
INTRAMUSCULAR | Status: DC | PRN
  Administered 2016-02-06: 50 ug via INTRAVENOUS
  Administered 2016-02-06: 150 ug via INTRAVENOUS
  Administered 2016-02-06: 50 ug via INTRAVENOUS

## 2016-02-06 MED ORDER — SODIUM CHLORIDE 0.9 % IR SOLN
Status: DC | PRN
  Administered 2016-02-06: 500 mL

## 2016-02-06 MED ORDER — MIDAZOLAM HCL 5 MG/5ML IJ SOLN
INTRAMUSCULAR | Status: DC | PRN
  Administered 2016-02-06: 2 mg via INTRAVENOUS

## 2016-02-06 MED ORDER — DOCUSATE SODIUM 100 MG PO CAPS
200.0000 mg | ORAL_CAPSULE | Freq: Every day | ORAL | Status: DC
  Administered 2016-02-06: 200 mg via ORAL
  Filled 2016-02-06: qty 2

## 2016-02-06 MED ORDER — WARFARIN SODIUM 2.5 MG PO TABS
2.5000 mg | ORAL_TABLET | Freq: Every day | ORAL | Status: DC
  Administered 2016-02-06: 2.5 mg via ORAL
  Filled 2016-02-06: qty 1

## 2016-02-06 MED ORDER — HYDROMORPHONE HCL 1 MG/ML IJ SOLN: 0.2500 mg | INTRAMUSCULAR | Status: DC | PRN

## 2016-02-06 SURGICAL SUPPLY — 79 items
BAG DECANTER FOR FLEXI CONT (MISCELLANEOUS) ×3 IMPLANT
BENZOIN TINCTURE PRP APPL 2/3 (GAUZE/BANDAGES/DRESSINGS) ×3 IMPLANT
BIT DRILL SM SPINE QC 14 (BIT) ×3 IMPLANT
BRUSH SCRUB EZ PLAIN DRY (MISCELLANEOUS) ×3 IMPLANT
BUR MATCHSTICK NEURO 3.0 LAGG (BURR) ×3 IMPLANT
CANISTER SUCT 3000ML PPV (MISCELLANEOUS) ×3 IMPLANT
CLOSURE WOUND 1/2 X4 (GAUZE/BANDAGES/DRESSINGS) ×1
DECANTER SPIKE VIAL GLASS SM (MISCELLANEOUS) ×3 IMPLANT
DRAPE C-ARM 42X72 X-RAY (DRAPES) ×6 IMPLANT
DRAPE LAPAROTOMY 100X72 PEDS (DRAPES) ×3 IMPLANT
DRAPE MICROSCOPE LEICA (MISCELLANEOUS) ×3 IMPLANT
DRAPE POUCH INSTRU U-SHP 10X18 (DRAPES) ×3 IMPLANT
DRSG OPSITE POSTOP 4X6 (GAUZE/BANDAGES/DRESSINGS) ×3 IMPLANT
DURAPREP 6ML APPLICATOR 50/CS (WOUND CARE) ×3 IMPLANT
ELECT COATED BLADE 2.86 ST (ELECTRODE) ×3 IMPLANT
ELECT REM PT RETURN 9FT ADLT (ELECTROSURGICAL) ×3
ELECTRODE REM PT RTRN 9FT ADLT (ELECTROSURGICAL) ×1 IMPLANT
GAUZE SPONGE 4X4 12PLY STRL (GAUZE/BANDAGES/DRESSINGS) IMPLANT
GAUZE SPONGE 4X4 16PLY XRAY LF (GAUZE/BANDAGES/DRESSINGS) IMPLANT
GLOVE BIO SURGEON STRL SZ 6.5 (GLOVE) ×4 IMPLANT
GLOVE BIO SURGEON STRL SZ7 (GLOVE) IMPLANT
GLOVE BIO SURGEON STRL SZ7.5 (GLOVE) IMPLANT
GLOVE BIO SURGEON STRL SZ8 (GLOVE) ×3 IMPLANT
GLOVE BIO SURGEON STRL SZ8.5 (GLOVE) IMPLANT
GLOVE BIO SURGEONS STRL SZ 6.5 (GLOVE) ×2
GLOVE BIOGEL M 8.0 STRL (GLOVE) IMPLANT
GLOVE BIOGEL PI IND STRL 6.5 (GLOVE) ×1 IMPLANT
GLOVE BIOGEL PI IND STRL 7.0 (GLOVE) ×1 IMPLANT
GLOVE BIOGEL PI INDICATOR 6.5 (GLOVE) ×2
GLOVE BIOGEL PI INDICATOR 7.0 (GLOVE) ×2
GLOVE ECLIPSE 6.5 STRL STRAW (GLOVE) IMPLANT
GLOVE ECLIPSE 7.0 STRL STRAW (GLOVE) IMPLANT
GLOVE ECLIPSE 7.5 STRL STRAW (GLOVE) IMPLANT
GLOVE ECLIPSE 8.0 STRL XLNG CF (GLOVE) IMPLANT
GLOVE ECLIPSE 8.5 STRL (GLOVE) IMPLANT
GLOVE ECLIPSE 9.0 STRL (GLOVE) ×3 IMPLANT
GLOVE EXAM NITRILE LRG STRL (GLOVE) IMPLANT
GLOVE EXAM NITRILE MD LF STRL (GLOVE) IMPLANT
GLOVE EXAM NITRILE XL STR (GLOVE) IMPLANT
GLOVE EXAM NITRILE XS STR PU (GLOVE) IMPLANT
GLOVE INDICATOR 6.5 STRL GRN (GLOVE) IMPLANT
GLOVE INDICATOR 7.0 STRL GRN (GLOVE) IMPLANT
GLOVE INDICATOR 7.5 STRL GRN (GLOVE) IMPLANT
GLOVE INDICATOR 8.0 STRL GRN (GLOVE) IMPLANT
GLOVE INDICATOR 8.5 STRL (GLOVE) ×3 IMPLANT
GLOVE OPTIFIT SS 8.0 STRL (GLOVE) IMPLANT
GLOVE SURG SS PI 6.5 STRL IVOR (GLOVE) IMPLANT
GOWN STRL REUS W/ TWL LRG LVL3 (GOWN DISPOSABLE) ×1 IMPLANT
GOWN STRL REUS W/ TWL XL LVL3 (GOWN DISPOSABLE) ×2 IMPLANT
GOWN STRL REUS W/TWL 2XL LVL3 (GOWN DISPOSABLE) IMPLANT
GOWN STRL REUS W/TWL LRG LVL3 (GOWN DISPOSABLE) ×2
GOWN STRL REUS W/TWL XL LVL3 (GOWN DISPOSABLE) ×4
HALTER HD/CHIN CERV TRACTION D (MISCELLANEOUS) ×3 IMPLANT
HEMOSTAT POWDER KIT SURGIFOAM (HEMOSTASIS) ×3 IMPLANT
KIT BASIN OR (CUSTOM PROCEDURE TRAY) ×3 IMPLANT
KIT ROOM TURNOVER OR (KITS) ×3 IMPLANT
LIQUID BAND (GAUZE/BANDAGES/DRESSINGS) ×3 IMPLANT
NEEDLE HYPO 18GX1.5 BLUNT FILL (NEEDLE) IMPLANT
NEEDLE SPNL 20GX3.5 QUINCKE YW (NEEDLE) ×3 IMPLANT
NS IRRIG 1000ML POUR BTL (IV SOLUTION) ×3 IMPLANT
PACK LAMINECTOMY NEURO (CUSTOM PROCEDURE TRAY) ×3 IMPLANT
PAD ARMBOARD 7.5X6 YLW CONV (MISCELLANEOUS) ×9 IMPLANT
PATTIES SURGICAL .5 X.5 (GAUZE/BANDAGES/DRESSINGS) ×3 IMPLANT
PLATE ANT CERV XTEND 1 LV 16 (Plate) ×3 IMPLANT
RUBBERBAND STERILE (MISCELLANEOUS) ×6 IMPLANT
SCREW XTD VAR 4.2 SELF TAP (Screw) ×6 IMPLANT
SCREW XTEND SELFTAP VAR 4.6X14 (Screw) ×6 IMPLANT
SLEEVE SURGEON STRL (DRAPES) ×3 IMPLANT
SPACER CERV FRGE 12X14X6-0 (Spacer) ×3 IMPLANT
SPONGE INTESTINAL PEANUT (DISPOSABLE) ×3 IMPLANT
SPONGE SURGIFOAM ABS GEL SZ50 (HEMOSTASIS) ×3 IMPLANT
STRIP CLOSURE SKIN 1/2X4 (GAUZE/BANDAGES/DRESSINGS) ×2 IMPLANT
SUT VIC AB 3-0 SH 8-18 (SUTURE) ×3 IMPLANT
SUT VICRYL 4-0 PS2 18IN ABS (SUTURE) ×3 IMPLANT
TAPE CLOTH 4X10 WHT NS (GAUZE/BANDAGES/DRESSINGS) IMPLANT
TOWEL OR 17X24 6PK STRL BLUE (TOWEL DISPOSABLE) ×3 IMPLANT
TOWEL OR 17X26 10 PK STRL BLUE (TOWEL DISPOSABLE) ×3 IMPLANT
TRAP SPECIMEN MUCOUS 40CC (MISCELLANEOUS) ×3 IMPLANT
WATER STERILE IRR 1000ML POUR (IV SOLUTION) ×3 IMPLANT

## 2016-02-06 NOTE — Transfer of Care (Signed)
Immediate Anesthesia Transfer of Care Note  Patient: Sierra Leach  Procedure(s) Performed: Procedure(s): ANTERIOR CERVICAL DECOMPRESSION FUSION CERVICAL THREE-FOUR WITH REMOVAL OF PLATE CERVICAL FOUR-SEVEN   (N/A)  Patient Location: PACU  Anesthesia Type:General  Level of Consciousness: awake and alert   Airway & Oxygen Therapy: Patient Spontanous Breathing and Patient connected to nasal cannula oxygen  Post-op Assessment: Report given to RN, Post -op Vital signs reviewed and stable and Patient moving all extremities X 4  Post vital signs: Reviewed and stable  Last Vitals:  Vitals:   02/06/16 0602  BP: (!) 144/104  Pulse: 72  Resp: 20  Temp: 36.9 C    Last Pain: There were no vitals filed for this visit.    Patients Stated Pain Goal: 3 (02/06/16 0602)  Complications: No apparent anesthesia complications   HR 86, RR 14, Sats 96%, BP 156/100

## 2016-02-06 NOTE — H&P (Signed)
Sierra Leach is an 65 y.o. female.   Chief Complaint: Neck pain right shoulder pain HPI: Patient is very pleasant 65 year old female previously undergone a C4-C7 fusion now presents with progressive worsening neck pain to her right shoulder workup has revealed progressive degeneration deterioration C3-4 above her fusion. Due to her progression of clinical syndrome failure conservative treatment I recommended an ACDF at C3-4 with removal of hardware C4-C7. I've extensively gone over the risks and benefits of that operation with the patient as well as perioperative course expectations of outcome and alternatives surgery and she understood and agreed to proceed forward.  Past Medical History:  Diagnosis Date  . Anxiety   . Arthritis   . GERD (gastroesophageal reflux disease)    occ  . History of kidney stones   . Hypertension   . Lumbar back pain   . Peripheral vascular disease (HCC) 13   pulmonary embolus  . PONV (postoperative nausea and vomiting)   . Shortness of breath dyspnea   . Stroke (HCC) 99   lefe leg weakness    Past Surgical History:  Procedure Laterality Date  . ABDOMINAL HYSTERECTOMY    . BACK SURGERY     x8  . bone spurs     multiple  . BREAST SURGERY Left    lumpectomy x3  . LUMBAR WOUND DEBRIDEMENT N/A 09/04/2013   Procedure: irrigation and debridement of lumbar wound;  Surgeon: Gwynne Edinger, MD;  Location: [facility];  Service: Neurosurgery;  Laterality: N/A;  irrigation and debridement of lumbar wound  . SHOULDER ARTHROSCOPY Left 03   rotatoer cuff  . SHOULDER ARTHROSCOPY Right 99   bone spur    History reviewed. No pertinent family history. Social History:  reports that she has never smoked. She has never used smokeless tobacco. She reports that she does not drink alcohol or use drugs.  Allergies:  Allergies  Allergen Reactions  . Penicillins Hives, Itching and Rash    Has patient had a PCN reaction causing immediate rash, facial/tongue/throat  swelling, SOB or lightheadedness with hypotension: Yes Has patient had a PCN reaction causing severe rash involving mucus membranes or skin necrosis: No Has patient had a PCN reaction that required hospitalization No Has patient had a PCN reaction occurring within the last 10 years: No If all of the above answers are "NO", then may proceed with Cephalosporin use.   . Sulfa Antibiotics Other (See Comments)    Breaks mouth out in blisters  . Adhesive [Tape] Rash    Medications Prior to Admission  Medication Sig Dispense Refill  . acetaminophen (TYLENOL) 500 MG tablet Take 1,500 mg by mouth daily as needed for mild pain.    Marland Kitchen allopurinol (ZYLOPRIM) 100 MG tablet Take 100 mg by mouth daily.    . carvedilol (COREG) 12.5 MG tablet Take 12.5 mg by mouth 2 (two) times daily with a meal.    . citalopram (CELEXA) 10 MG tablet Take 10 mg by mouth daily.    Marland Kitchen docusate sodium (COLACE) 100 MG capsule Take 200 mg by mouth at bedtime.    Marland Kitchen oxycodone (OXY-IR) 5 MG capsule Take 5 mg by mouth every 6 (six) hours as needed for pain.   0  . tizanidine (ZANAFLEX) 2 MG capsule Take 2 mg by mouth See admin instructions. Take 2 mg every evening and during the day as needed    . warfarin (COUMADIN) 2.5 MG tablet Take 2.5 mg by mouth daily at 6 PM.     .  warfarin (COUMADIN) 3 MG tablet Take 3-6 mg by mouth See admin instructions. Monday 6 mg, all other days 3 mg  1  . diazepam (VALIUM) 5 MG tablet Take 5 mg by mouth every 6 (six) hours as needed for anxiety.       No results found for this or any previous visit (from the past 48 hour(s)). No results found.  Review of Systems  Constitutional: Negative.   HENT: Negative.   Eyes: Negative.   Respiratory: Negative.   Cardiovascular: Negative.   Gastrointestinal: Negative.   Genitourinary: Negative.   Musculoskeletal: Positive for joint pain, myalgias and neck pain.  Skin: Negative.   Neurological: Negative.   Psychiatric/Behavioral: Negative.     Blood  pressure (!) 144/104, pulse 72, temperature 98.4 F (36.9 C), resp. rate 20, weight 101.6 kg (224 lb), SpO2 97 %. Physical Exam  Constitutional: She is oriented to person, place, and time. She appears well-developed and well-nourished.  HENT:  Head: Normocephalic.  Eyes: Pupils are equal, round, and reactive to light.  Neck: Normal range of motion.  Respiratory: Effort normal.  GI: Soft. Bowel sounds are normal.  Neurological: She is alert and oriented to person, place, and time. She has normal strength. GCS eye subscore is 4. GCS verbal subscore is 5. GCS motor subscore is 6.  She is awake alert strength 5 out of 5 deltoid, bicep, tricep, wrist flexion, wrist 6 inch, and intrinsics.  Skin: Skin is warm and dry.     Assessment/Plan 65 year female presents for ACDF at C3-4.  Hamad Whyte P, MD 02/06/2016, 7:19 AM

## 2016-02-06 NOTE — Op Note (Signed)
Preoperative diagnosis: Cervical spondylosis and stenosis at C3-4  Postoperative diagnosis: Same  Procedure: Anterior cervical discectomy and fusion at C3-4 using allograft and the globus extend plating system with exploration of fusion removal of hardware C4-C7.  Surgeon: Jillyn Hidden Salem Lembke  Cyst: Sherilyn Cooter pool  Anesthesia: Gen.  EBL: Minimal  History of present illness: Patient is very pleasant 65 year old female is a progress worsening neck pain and workup revealed a large discoloration as well as severe spondylosis and a large spur causing cord compression and stenosis C3-4 above a previous C4-C7 fusion. Due to patient's failure conservative treatment imaging findings and progression of clinical syndrome I recommended a ACDF at C3-4. I extensively went over the risks and benefits of the operation the patient as well as perioperative course expectations of outcome and alternatives of surgery and she understood and agreed to proceed forward.  Operative procedure: Patient brought into the or was induced under general anesthesia positioned supine the neck in slight extension in 5 pounds of halter traction the right side of her was neck was prepped and draped in routine sterile fashion.  X-ray localize the appropriate level which coincided with her previous incision so after adequate localization curvilinear incision made through her old incision scar tissues dissected free along the platysmas and divided longitudinally and the avascular tissue between the sternomastoid and strap muscles developed and the prevertebral fashion. Prevertebral fascia was dissected away with Kitners. He'll plate was immediately identified identify the space above this I dissected out the old plate and removed all the old screws and reveal plate. The fusion did appear to be solid. At this point the longus was reflected laterally at the level at C3-4. Self-retaining retractor was placed and the disc space was incised and cleanout due  to rongeurs a BA curette then under Mike's cup illumination the posterior aspect of the cyst was drilled down the posterior annulus and posterior ligament was identified and removed in piecemeal fashion. There is a large spur coming off the posterior aspect of the C3 vertebral body causing cord compression towards the left this is all aggressively under bitten and the central canal was decompressed marking laterally both C4 pedicles were identified both C4 nerve roots were decompressed flush with pedicle. After adequate decompression achieved both centrally and foraminally a allograft was sized and a 6 no a parallel was inserted then a 16 mm globus plate was placed to rescue screws and 214 mm radial screws are placed all screws excellent purchase locking mechanism was engaged. Postop fluoroscopy confirmed good position of all the implants. The wound scope was irrigated meticulous hemostasis was maintained and the platysmas reapproximated after Vicryl scans concerning for septic or Dermabond benzo and Steri-Strips and sterile dressing was applied and patient recovered in stable condition. At the end of case all needle counts sponge counts were correct.

## 2016-02-06 NOTE — Anesthesia Postprocedure Evaluation (Signed)
Anesthesia Post Note  Patient: Sierra Leach  Procedure(s) Performed: Procedure(s) (LRB): ANTERIOR CERVICAL DECOMPRESSION FUSION CERVICAL THREE-FOUR WITH REMOVAL OF PLATE CERVICAL FOUR-SEVEN   (N/A)  Patient location during evaluation: PACU Anesthesia Type: General Level of consciousness: awake and sedated Pain management: pain level controlled Vital Signs Assessment: post-procedure vital signs reviewed and stable Respiratory status: spontaneous breathing Cardiovascular status: stable Postop Assessment: no signs of nausea or vomiting Anesthetic complications: no Comments: Patient will be given a dose of her Coreg for a BP of 180/90.     Last Vitals:  Vitals:   02/06/16 1014 02/06/16 1015  BP: (!) 168/105   Pulse: 83 80  Resp: 16 19  Temp:      Last Pain: There were no vitals filed for this visit. Pain Goal: Patients Stated Pain Goal: 3 (02/06/16 0602)               Paulene Tayag JR,JOHN Susann Givens

## 2016-02-06 NOTE — Anesthesia Procedure Notes (Signed)
Procedure Name: Intubation Date/Time: 02/06/2016 7:36 AM Performed by: Glo Herring B Pre-anesthesia Checklist: Patient identified, Emergency Drugs available, Suction available and Patient being monitored Patient Re-evaluated:Patient Re-evaluated prior to inductionOxygen Delivery Method: Circle system utilized Preoxygenation: Pre-oxygenation with 100% oxygen Intubation Type: IV induction Ventilation: Mask ventilation without difficulty Laryngoscope Size: Miller and 2 Grade View: Grade II Tube type: Oral Tube size: 7.0 mm Number of attempts: 1 Airway Equipment and Method: Stylet Placement Confirmation: positive ETCO2 and breath sounds checked- equal and bilateral Secured at: 21 cm Tube secured with: Tape Dental Injury: Teeth and Oropharynx as per pre-operative assessment

## 2016-02-07 ENCOUNTER — Encounter (HOSPITAL_COMMUNITY): Payer: Self-pay | Admitting: Neurosurgery

## 2016-02-07 LAB — PROTIME-INR
INR: 1.05
Prothrombin Time: 13.7 seconds (ref 11.4–15.2)

## 2016-02-07 MED ORDER — OXYCODONE-ACETAMINOPHEN 5-325 MG PO TABS: 1.0000 | ORAL_TABLET | ORAL | 0 refills | Status: DC | PRN

## 2016-02-07 MED ORDER — CYCLOBENZAPRINE HCL 10 MG PO TABS: 10.0000 mg | ORAL_TABLET | Freq: Three times a day (TID) | ORAL | 0 refills | Status: DC | PRN

## 2016-02-07 NOTE — Discharge Instructions (Signed)
No lifting no bending no twisting no driving ° °Wound Care °Keep incision covered and dry for one week.  If you shower prior to then, cover incision with plastic wrap.  °You may remove outer bandage after one week and shower.  °Do not put any creams, lotions, or ointments on incision. °Leave steri-strips on neck.  They will fall off by themselves. °Activity °Walk each and every day, increasing distance each day. °No lifting greater than 5 lbs.  Avoid excessive neck motion. °No driving for 2 weeks; may ride as a passenger locally. °Wear neck brace at all times except when showering or otherwise instructed. °Diet °Resume your normal diet.  °Return to Work °Will be discussed at you follow up appointment. °Call Your Doctor If Any of These Occur °Redness, drainage, or swelling at the wound.  °Temperature greater than 101 degrees. °Severe pain not relieved by pain medication. °Increased difficulty swallowing.  °Incision starts to come apart. °Follow Up Appt °Call today for appointment in 1-2 weeks ([phone]) or for problems.  If you have any hardware placed in your spine, you will need an x-ray before your appointment. °

## 2016-02-07 NOTE — Progress Notes (Signed)
Patient ID: Sierra Leach, female   DOB: 1951/01/04, 65 y.o.   MRN: [id number] Doing Well no neck pain  Strength out of 5 wound flat  Discharge home

## 2016-02-07 NOTE — Progress Notes (Signed)
Pt doing well. Pt given D/C instructions with Rx's, verbal understanding was provided. Pt's incision is covered with Honeycomb dressing and is clean and dry. Pt's IV was removed prior to D/C. Pt D/C'd home via wheelchair @ 1100 per MD order. Pt is stable @ D/C and has no other needs at this time. Rema Fendt, RN

## 2016-02-07 NOTE — Discharge Summary (Signed)
  Physician Discharge Summary  Patient ID: Sierra Leach MRN: [id number] DOB/AGE: 08/10/1950 65 y.o.  Admit date: 02/06/2016 Discharge date: 02/07/2016  Admission Diagnoses:Cervical spondylosis with stenosis C3-4  Discharge Diagnoses: Same Active Problems:   Spinal stenosis in cervical region   Discharged Condition: good  Hospital Course: Patient is medical Hospital underwent exploration of fusion removal of hardware an ACDF at C3-4. Postoperatively patient did very well recovered in the floor on the floor was angling and voiding spontaneous he tolerating regular diet stable for discharge home. Patient may resume her warfarin dose at her normal schedule starting this evening.  Consults: Significant Diagnostic Studies: Treatments: ACDF C3-4 Discharge Exam: Blood pressure 109/74, pulse 68, temperature 97.8 F (36.6 C), temperature source Oral, resp. rate 20, weight 101.6 kg (224 lb), SpO2 94 %. Strength 5 out of 5 wound clean dry and intact  Disposition: Home     Medication List    TAKE these medications   acetaminophen 500 MG tablet Commonly known as:  TYLENOL Take 1,500 mg by mouth daily as needed for mild pain.   allopurinol 100 MG tablet Commonly known as:  ZYLOPRIM Take 100 mg by mouth daily.   carvedilol 12.5 MG tablet Commonly known as:  COREG Take 12.5 mg by mouth 2 (two) times daily with a meal.   citalopram 10 MG tablet Commonly known as:  CELEXA Take 10 mg by mouth daily.   cyclobenzaprine 10 MG tablet Commonly known as:  FLEXERIL Take 1 tablet (10 mg total) by mouth 3 (three) times daily as needed for muscle spasms.   diazepam 5 MG tablet Commonly known as:  VALIUM Take 5 mg by mouth every 6 (six) hours as needed for anxiety.   docusate sodium 100 MG capsule Commonly known as:  COLACE Take 200 mg by mouth at bedtime.   oxycodone 5 MG capsule Commonly known as:  OXY-IR Take 5 mg by mouth every 6 (six) hours as needed for pain.    oxyCODONE-acetaminophen 5-325 MG tablet Commonly known as:  PERCOCET/ROXICET Take 1-2 tablets by mouth every 4 (four) hours as needed for moderate pain.   tizanidine 2 MG capsule Commonly known as:  ZANAFLEX Take 2 mg by mouth See admin instructions. Take 2 mg every evening and during the day as needed   warfarin 2.5 MG tablet Commonly known as:  COUMADIN Take 2.5 mg by mouth daily at 6 PM.   warfarin 3 MG tablet Commonly known as:  COUMADIN Take 3-6 mg by mouth See admin instructions. Monday 6 mg, all other days 3 mg      Follow-up Information    Rashon Westrup P, MD .   Specialty:  Neurosurgery Contact information: [address] [place] [address] [phone]        Mariam Dollar, MD .   Specialty:  Neurosurgery Contact information: [address] [place] [address] [phone]           Signed: Mariam Dollar 02/07/2016, 7:53 AM

## 2016-04-09 ENCOUNTER — Other Ambulatory Visit: Payer: Self-pay | Admitting: Neurosurgery

## 2016-04-09 DIAGNOSIS — M5023 Other cervical disc displacement, cervicothoracic region: Secondary | ICD-10-CM

## 2016-04-19 ENCOUNTER — Ambulatory Visit
Admission: RE | Admit: 2016-04-19 | Discharge: 2016-04-19 | Disposition: A | Discharge status: Home / Self Care | Payer: Medicare HMO | Source: Ambulatory Visit | Attending: Neurosurgery | Admitting: Neurosurgery

## 2016-04-19 DIAGNOSIS — M5023 Other cervical disc displacement, cervicothoracic region: Secondary | ICD-10-CM

## 2016-04-19 MED ORDER — IOPAMIDOL (ISOVUE-M 300) INJECTION 61%
1.0000 mL | Freq: Once | INTRAMUSCULAR | Status: AC | PRN
  Administered 2016-04-19: 1 mL via EPIDURAL

## 2016-04-19 MED ORDER — TRIAMCINOLONE ACETONIDE 40 MG/ML IJ SUSP (RADIOLOGY)
60.0000 mg | Freq: Once | INTRAMUSCULAR | Status: AC
  Administered 2016-04-19: 60 mg via EPIDURAL

## 2016-04-19 NOTE — Discharge Instructions (Signed)

## 2016-11-06 ENCOUNTER — Other Ambulatory Visit: Payer: Self-pay | Admitting: Neurosurgery

## 2016-11-06 DIAGNOSIS — M542 Cervicalgia: Secondary | ICD-10-CM

## 2016-11-15 ENCOUNTER — Ambulatory Visit
Admission: RE | Admit: 2016-11-15 | Discharge: 2016-11-15 | Disposition: A | Discharge status: Home / Self Care | Payer: Medicare HMO | Source: Ambulatory Visit | Attending: Neurosurgery | Admitting: Neurosurgery

## 2016-11-15 DIAGNOSIS — M542 Cervicalgia: Secondary | ICD-10-CM

## 2016-11-15 MED ORDER — TRIAMCINOLONE ACETONIDE 40 MG/ML IJ SUSP (RADIOLOGY)
60.0000 mg | Freq: Once | INTRAMUSCULAR | Status: AC
  Administered 2016-11-15: 60 mg via EPIDURAL

## 2016-11-15 MED ORDER — IOPAMIDOL (ISOVUE-M 300) INJECTION 61%
1.0000 mL | Freq: Once | INTRAMUSCULAR | Status: AC | PRN
  Administered 2016-11-15: 1 mL via EPIDURAL

## 2016-11-15 NOTE — Discharge Instructions (Signed)

## 2016-12-05 ENCOUNTER — Other Ambulatory Visit: Payer: Self-pay | Admitting: Neurosurgery

## 2016-12-14 ENCOUNTER — Encounter (HOSPITAL_COMMUNITY): Payer: Self-pay

## 2016-12-14 ENCOUNTER — Other Ambulatory Visit: Payer: Self-pay

## 2016-12-14 ENCOUNTER — Encounter (HOSPITAL_COMMUNITY)
Admission: RE | Admit: 2016-12-14 | Discharge: 2016-12-14 | Disposition: A | Discharge status: Home / Self Care | Payer: Medicare HMO | Source: Ambulatory Visit | Attending: Neurosurgery | Admitting: Neurosurgery

## 2016-12-14 DIAGNOSIS — I10 Essential (primary) hypertension: Secondary | ICD-10-CM | POA: Diagnosis not present

## 2016-12-14 DIAGNOSIS — Z8673 Personal history of transient ischemic attack (TIA), and cerebral infarction without residual deficits: Secondary | ICD-10-CM | POA: Insufficient documentation

## 2016-12-14 DIAGNOSIS — R001 Bradycardia, unspecified: Secondary | ICD-10-CM | POA: Diagnosis not present

## 2016-12-14 DIAGNOSIS — Z01812 Encounter for preprocedural laboratory examination: Secondary | ICD-10-CM | POA: Insufficient documentation

## 2016-12-14 DIAGNOSIS — K219 Gastro-esophageal reflux disease without esophagitis: Secondary | ICD-10-CM | POA: Insufficient documentation

## 2016-12-14 LAB — CBC
HCT: 42.2 % (ref 36.0–46.0)
HEMOGLOBIN: 13.7 g/dL (ref 12.0–15.0)
MCH: 29.9 pg (ref 26.0–34.0)
MCHC: 32.5 g/dL (ref 30.0–36.0)
MCV: 92.1 fL (ref 78.0–100.0)
PLATELETS: 189 10*3/uL (ref 150–400)
RBC: 4.58 MIL/uL (ref 3.87–5.11)
RDW: 14.1 % (ref 11.5–15.5)
WBC: 5.1 10*3/uL (ref 4.0–10.5)

## 2016-12-14 LAB — BASIC METABOLIC PANEL
ANION GAP: 7 (ref 5–15)
BUN: 13 mg/dL (ref 6–20)
CALCIUM: 9.1 mg/dL (ref 8.9–10.3)
CO2: 26 mmol/L (ref 22–32)
CREATININE: 0.97 mg/dL (ref 0.44–1.00)
Chloride: 107 mmol/L (ref 101–111)
Glucose, Bld: 97 mg/dL (ref 65–99)
Potassium: 3.5 mmol/L (ref 3.5–5.1)
SODIUM: 140 mmol/L (ref 135–145)

## 2016-12-14 LAB — TYPE AND SCREEN
ABO/RH(D): B POS
ANTIBODY SCREEN: NEGATIVE

## 2016-12-14 LAB — PROTIME-INR
INR: 1.39
PROTHROMBIN TIME: 17.2 s — AB (ref 11.4–15.2)

## 2016-12-14 LAB — SURGICAL PCR SCREEN
MRSA, PCR: NEGATIVE
STAPHYLOCOCCUS AUREUS: NEGATIVE

## 2016-12-14 NOTE — Pre-Procedure Instructions (Signed)
Sierra GottronWanda C Leach  12/14/2016      [facility] [id number] - [place], [place] - [address] AT [facility] [address] [place] [id number] Phone: [phone] Fax: [phone]  [id number] - [place], [place] - [address] [place] [id number] Phone: [phone] Fax: [phone]    Your procedure is scheduled on June 13 .  [facility] Admitting at [phone] A.M.  Call this number if you have problems the morning of surgery:  [phone]   Remember:  Do not eat food or drink liquids after midnight.  Take these medicines the morning of surgery with A SIP OF WATER  Tylenol if needed, allopurinol (Zyloprim), carvedilol (Coreg), citalopram (Celexa), Diazepam (Valium) if needed, Hydrocodone (Norco) if needed, meclizine (Antivert) if needed, Tizanidine (Zanaflex) if needed    7 days before your surgery stop taking aspirin, BC's, Goody's, Herbal medications, Fish Oil, Vitamins, Ibuprofen, Advil, Motrin, Aleve   Do not wear jewelry, make-up or nail polish.  Do not wear lotions, powders, or perfumes, or deoderant.  Do not shave 48 hours prior to surgery.  Men may shave face and neck.  Do not bring valuables to the hospital.  [facility] is not responsible for any belongings or valuables.  Contacts, dentures or bridgework may not be worn into surgery.  Leave your suitcase in the car.  After surgery it may be brought to your room.  For patients admitted to the hospital, discharge time will be determined by your treatment team.  Patients discharged the day of surgery will not be allowed to drive home.    Special instructions:  [facility] - Preparing for Surgery  Before surgery, you can play an important role.  Because skin is not sterile, your skin needs to be as free of germs as possible.  You can reduce the number of germs on you skin by washing with CHG (chlorahexidine gluconate) soap before surgery.  CHG is  an antiseptic cleaner which kills germs and bonds with the skin to continue killing germs even after washing.  Please DO NOT use if you have an allergy to CHG or antibacterial soaps.  If your skin becomes reddened/irritated stop using the CHG and inform your nurse when you arrive at Short Stay.  Do not shave (including legs and underarms) for at least 48 hours prior to the first CHG shower.  You may shave your face.  Please follow these instructions carefully:   1.  Shower with CHG Soap the night before surgery and the                                morning of Surgery.  2.  If you choose to wash your hair, wash your hair first as usual with your       normal shampoo.  3.  After you shampoo, rinse your hair and body thoroughly to remove the                      Shampoo.  4.  Use CHG as you would any other liquid soap.  You can apply chg directly       to the skin and wash gently with scrungie or a clean washcloth.  5.  Apply the CHG Soap to your body ONLY FROM THE NECK DOWN.  Do not use on open wounds or open sores.  Avoid contact with your eyes,       ears, mouth and genitals (private parts).  Wash genitals (private parts)       with your normal soap.  6.  Wash thoroughly, paying special attention to the area where your surgery        will be performed.  7.  Thoroughly rinse your body with warm water from the neck down.  8.  DO NOT shower/wash with your normal soap after using and rinsing off       the CHG Soap.  9.  Pat yourself dry with a clean towel.            10.  Wear clean pajamas.            11.  Place clean sheets on your bed the night of your first shower and do not        sleep with pets.  Day of Surgery  Do not apply any lotions/deoderants the morning of surgery.  Please wear clean clothes to the hospital/surgery center.     Please read over the following fact sheets that you were given. Pain Booklet, Coughing and Deep Breathing, MRSA Information and Surgical Site  Infection Prevention

## 2016-12-14 NOTE — Progress Notes (Addendum)
PCP is Dr. Barbaraann Barthelichard Escajada Denies ever seeing a cardiologist. Denies ever having a stress test, card cath., or echo. Denies having a cough or fever. States she sometimes has chest pain that Dr Wynetta Emeryram says is related to her problem with her neck. States no chest pain at this time, last had chest pain last night, but once she moved her position it was better. States that the pain has been worse the last 2 weeks. The pain starts in her back and moves forward. Stroke was 19 years ago and has some left leg weakness. Reports last coumadin dose was 12-12-16 Order placed for EKG -  Rica MastAngela Kabbe, NP called and informed.

## 2016-12-17 NOTE — Progress Notes (Signed)
Anesthesia Chart Review:  Pt is a 66 year old female scheduled for C7-T1 posterior cervical fusion with lateral mass fixation with discectomy on left on 12/19/2016 with Donalee CitrinGary Cram, MD  PMH includes:  Stroke, HTN, PE, post-op N/V, GERD. Never smoker. BMI 39. S/p ACDF 02/06/16.   Medications include: Carvedilol, Coumadin. Last dose Coumadin 12/12/16  BP (!) 150/94   Pulse 60   Temp 36.7 C   Resp 20   Ht 5\' 3"  (1.6 m)   Wt 221 lb 3.2 oz (100.3 kg)   SpO2 95%   BMI 39.18 kg/m    Preoperative labs reviewed.  PT 17.2.  Will repeat DOS  EKG 12/14/16: Sinus bradycardia (54 bpm). T wave abnormality, consider lateral ischemia. T wave abnormality has been noted on EKGs dating back to at least 07/28/07.    Pt reported chest pain at PAT on and off.  None at the time of PAT.  Starts in back and moves to chest.  Onset associated with certain positions and pain is relieved with changing position.  Denies associated SOB, N/V, diaphoresis, palpitations, anxiety, dizziness or syncope.  Pt reports she has been told by Dr. Wynetta Emeryram that her sx are due to her neck problems.    EKG is stable over many years.  Chest pain sx positional.  If labs acceptable DOS, I anticipate pt can proceed with surgery as scheduled.   Rica Mastngela Kaiyon Hynes, FNP-BC [facility] Phone: [phone] 12/17/2016 9:55 AM

## 2016-12-19 ENCOUNTER — Inpatient Hospital Stay (HOSPITAL_COMMUNITY): Payer: Medicare HMO | Admitting: Emergency Medicine

## 2016-12-19 ENCOUNTER — Inpatient Hospital Stay (HOSPITAL_COMMUNITY): Payer: Medicare HMO

## 2016-12-19 ENCOUNTER — Inpatient Hospital Stay (HOSPITAL_COMMUNITY)
Admission: RE | Disposition: A | Discharge status: Home / Self Care | Payer: Self-pay | Source: Ambulatory Visit | Attending: Neurosurgery

## 2016-12-19 ENCOUNTER — Inpatient Hospital Stay (HOSPITAL_COMMUNITY)
Admission: RE | Admit: 2016-12-19 | Discharge: 2016-12-21 | DRG: 473 | Disposition: A | Discharge status: Home / Self Care | Payer: Medicare HMO | Source: Ambulatory Visit | Attending: Neurosurgery | Admitting: Neurosurgery

## 2016-12-19 ENCOUNTER — Encounter (HOSPITAL_COMMUNITY): Payer: Self-pay

## 2016-12-19 DIAGNOSIS — Z8673 Personal history of transient ischemic attack (TIA), and cerebral infarction without residual deficits: Secondary | ICD-10-CM

## 2016-12-19 DIAGNOSIS — Z79899 Other long term (current) drug therapy: Secondary | ICD-10-CM | POA: Diagnosis not present

## 2016-12-19 DIAGNOSIS — K219 Gastro-esophageal reflux disease without esophagitis: Secondary | ICD-10-CM | POA: Diagnosis present

## 2016-12-19 DIAGNOSIS — M47812 Spondylosis without myelopathy or radiculopathy, cervical region: Secondary | ICD-10-CM | POA: Diagnosis present

## 2016-12-19 DIAGNOSIS — I10 Essential (primary) hypertension: Secondary | ICD-10-CM | POA: Diagnosis present

## 2016-12-19 DIAGNOSIS — Z7901 Long term (current) use of anticoagulants: Secondary | ICD-10-CM

## 2016-12-19 DIAGNOSIS — M4802 Spinal stenosis, cervical region: Secondary | ICD-10-CM | POA: Diagnosis present

## 2016-12-19 DIAGNOSIS — Z882 Allergy status to sulfonamides status: Secondary | ICD-10-CM | POA: Diagnosis not present

## 2016-12-19 DIAGNOSIS — Z880 Allergy status to penicillin: Secondary | ICD-10-CM

## 2016-12-19 DIAGNOSIS — Z419 Encounter for procedure for purposes other than remedying health state, unspecified: Secondary | ICD-10-CM

## 2016-12-19 HISTORY — PX: POSTERIOR CERVICAL FUSION/FORAMINOTOMY: SHX5038

## 2016-12-19 LAB — PROTIME-INR
INR: 0.96
Prothrombin Time: 12.8 seconds (ref 11.4–15.2)

## 2016-12-19 SURGERY — POSTERIOR CERVICAL FUSION/FORAMINOTOMY LEVEL 1
Anesthesia: General | Laterality: Left

## 2016-12-19 MED ORDER — HYDROCODONE-ACETAMINOPHEN 7.5-325 MG PO TABS: 1.0000 | ORAL_TABLET | Freq: Once | ORAL | Status: DC | PRN

## 2016-12-19 MED ORDER — PHENYLEPHRINE 40 MCG/ML (10ML) SYRINGE FOR IV PUSH (FOR BLOOD PRESSURE SUPPORT)
PREFILLED_SYRINGE | INTRAVENOUS | Status: AC
  Filled 2016-12-19: qty 10

## 2016-12-19 MED ORDER — HYDROMORPHONE HCL 1 MG/ML IJ SOLN
0.2500 mg | INTRAMUSCULAR | Status: DC | PRN
  Administered 2016-12-19 (×4): 0.5 mg via INTRAVENOUS

## 2016-12-19 MED ORDER — OXYCODONE HCL 5 MG PO TABS
5.0000 mg | ORAL_TABLET | ORAL | Status: DC | PRN
Start: 2016-12-19 — End: 2016-12-19
  Administered 2016-12-19: 10 mg via ORAL

## 2016-12-19 MED ORDER — PHENOL 1.4 % MT LIQD: 1.0000 | OROMUCOSAL | Status: DC | PRN

## 2016-12-19 MED ORDER — MIDAZOLAM HCL 5 MG/5ML IJ SOLN
INTRAMUSCULAR | Status: DC | PRN
  Administered 2016-12-19: 2 mg via INTRAVENOUS

## 2016-12-19 MED ORDER — LIDOCAINE-EPINEPHRINE 1 %-1:100000 IJ SOLN
INTRAMUSCULAR | Status: DC | PRN
  Administered 2016-12-19: 3.5 mL

## 2016-12-19 MED ORDER — ROCURONIUM BROMIDE 10 MG/ML (PF) SYRINGE
PREFILLED_SYRINGE | INTRAVENOUS | Status: AC
  Filled 2016-12-19: qty 5

## 2016-12-19 MED ORDER — THROMBIN 20000 UNITS EX SOLR
CUTANEOUS | Status: AC
  Filled 2016-12-19: qty 20000

## 2016-12-19 MED ORDER — ALUM & MAG HYDROXIDE-SIMETH 200-200-20 MG/5ML PO SUSP: 30.0000 mL | Freq: Four times a day (QID) | ORAL | Status: DC | PRN

## 2016-12-19 MED ORDER — ONDANSETRON HCL 4 MG/2ML IJ SOLN: 4.0000 mg | Freq: Once | INTRAMUSCULAR | Status: DC | PRN

## 2016-12-19 MED ORDER — ALLOPURINOL 100 MG PO TABS
100.0000 mg | ORAL_TABLET | Freq: Every day | ORAL | Status: DC
  Administered 2016-12-19 – 2016-12-20 (×2): 100 mg via ORAL
  Filled 2016-12-19 (×2): qty 1

## 2016-12-19 MED ORDER — BACITRACIN ZINC 500 UNIT/GM EX OINT
TOPICAL_OINTMENT | CUTANEOUS | Status: DC | PRN
  Administered 2016-12-19: 1 via TOPICAL

## 2016-12-19 MED ORDER — HYDROMORPHONE HCL 1 MG/ML IJ SOLN
INTRAMUSCULAR | Status: AC
  Administered 2016-12-19: 0.5 mg via INTRAVENOUS
  Filled 2016-12-19: qty 1

## 2016-12-19 MED ORDER — PANTOPRAZOLE SODIUM 40 MG IV SOLR
40.0000 mg | Freq: Every day | INTRAVENOUS | Status: DC
  Administered 2016-12-19 – 2016-12-20 (×2): 40 mg via INTRAVENOUS
  Filled 2016-12-19 (×2): qty 40

## 2016-12-19 MED ORDER — SODIUM CHLORIDE 0.9% FLUSH
3.0000 mL | Freq: Two times a day (BID) | INTRAVENOUS | Status: DC
  Administered 2016-12-19 – 2016-12-20 (×3): 3 mL via INTRAVENOUS

## 2016-12-19 MED ORDER — LIDOCAINE HCL (CARDIAC) 20 MG/ML IV SOLN
INTRAVENOUS | Status: DC | PRN
  Administered 2016-12-19: 100 mg via INTRAVENOUS

## 2016-12-19 MED ORDER — OXYCODONE HCL 5 MG PO TABS
ORAL_TABLET | ORAL | Status: AC
  Administered 2016-12-19: 10 mg via ORAL
  Filled 2016-12-19: qty 2

## 2016-12-19 MED ORDER — ACETAMINOPHEN 650 MG RE SUPP: 650.0000 mg | RECTAL | Status: DC | PRN

## 2016-12-19 MED ORDER — ONDANSETRON HCL 4 MG/2ML IJ SOLN
4.0000 mg | Freq: Four times a day (QID) | INTRAMUSCULAR | Status: DC | PRN
  Administered 2016-12-20: 4 mg via INTRAVENOUS
  Filled 2016-12-19: qty 2

## 2016-12-19 MED ORDER — VANCOMYCIN HCL 500 MG IV SOLR
500.0000 mg | INTRAVENOUS | Status: AC
  Administered 2016-12-19: 500 mg via INTRAVENOUS
  Filled 2016-12-19: qty 500

## 2016-12-19 MED ORDER — MENTHOL 3 MG MT LOZG: 1.0000 | LOZENGE | OROMUCOSAL | Status: DC | PRN

## 2016-12-19 MED ORDER — VANCOMYCIN HCL IN DEXTROSE 750-5 MG/150ML-% IV SOLN
750.0000 mg | Freq: Two times a day (BID) | INTRAVENOUS | Status: DC
  Administered 2016-12-20 (×3): 750 mg via INTRAVENOUS
  Filled 2016-12-19 (×5): qty 150

## 2016-12-19 MED ORDER — LIDOCAINE-EPINEPHRINE 1 %-1:100000 IJ SOLN
INTRAMUSCULAR | Status: AC
  Filled 2016-12-19: qty 1

## 2016-12-19 MED ORDER — PROPOFOL 10 MG/ML IV BOLUS
INTRAVENOUS | Status: DC | PRN
  Administered 2016-12-19: 180 mg via INTRAVENOUS
  Administered 2016-12-19: 20 mg via INTRAVENOUS
  Administered 2016-12-19: 10 mg via INTRAVENOUS
  Administered 2016-12-19 (×2): 20 mg via INTRAVENOUS

## 2016-12-19 MED ORDER — PHENYLEPHRINE HCL 10 MG/ML IJ SOLN
INTRAMUSCULAR | Status: DC | PRN
  Administered 2016-12-19: 40 ug via INTRAVENOUS
  Administered 2016-12-19 (×3): 80 ug via INTRAVENOUS

## 2016-12-19 MED ORDER — 0.9 % SODIUM CHLORIDE (POUR BTL) OPTIME
TOPICAL | Status: DC | PRN
  Administered 2016-12-19: 1000 mL

## 2016-12-19 MED ORDER — CARVEDILOL 6.25 MG PO TABS: 12.5000 mg | ORAL_TABLET | Freq: Once | ORAL | Status: DC

## 2016-12-19 MED ORDER — LIDOCAINE 2% (20 MG/ML) 5 ML SYRINGE
INTRAMUSCULAR | Status: AC
  Filled 2016-12-19: qty 5

## 2016-12-19 MED ORDER — SUGAMMADEX SODIUM 200 MG/2ML IV SOLN
INTRAVENOUS | Status: DC | PRN
  Administered 2016-12-19: 200.6 mg via INTRAVENOUS

## 2016-12-19 MED ORDER — TIZANIDINE HCL 4 MG PO TABS
2.0000 mg | ORAL_TABLET | ORAL | Status: DC | PRN
  Administered 2016-12-20: 2 mg via ORAL
  Filled 2016-12-19: qty 1

## 2016-12-19 MED ORDER — BUPIVACAINE HCL (PF) 0.25 % IJ SOLN
INTRAMUSCULAR | Status: AC
  Filled 2016-12-19: qty 30

## 2016-12-19 MED ORDER — PHENYLEPHRINE HCL 10 MG/ML IJ SOLN
INTRAMUSCULAR | Status: DC | PRN
  Administered 2016-12-19: 50 ug/min via INTRAVENOUS
  Administered 2016-12-19: 20 ug/min via INTRAVENOUS

## 2016-12-19 MED ORDER — CHLORHEXIDINE GLUCONATE CLOTH 2 % EX PADS: 6.0000 | MEDICATED_PAD | Freq: Once | CUTANEOUS | Status: DC

## 2016-12-19 MED ORDER — MIDAZOLAM HCL 2 MG/2ML IJ SOLN
INTRAMUSCULAR | Status: AC
  Filled 2016-12-19: qty 2

## 2016-12-19 MED ORDER — THROMBIN 20000 UNITS EX SOLR
CUTANEOUS | Status: DC | PRN
  Administered 2016-12-19 (×2): via TOPICAL

## 2016-12-19 MED ORDER — CARVEDILOL 6.25 MG PO TABS
12.5000 mg | ORAL_TABLET | Freq: Two times a day (BID) | ORAL | Status: DC
  Administered 2016-12-20 – 2016-12-21 (×3): 12.5 mg via ORAL
  Filled 2016-12-19 (×3): qty 2

## 2016-12-19 MED ORDER — FENTANYL CITRATE (PF) 250 MCG/5ML IJ SOLN
INTRAMUSCULAR | Status: AC
  Filled 2016-12-19: qty 5

## 2016-12-19 MED ORDER — ALBUMIN HUMAN 5 % IV SOLN
INTRAVENOUS | Status: DC | PRN
  Administered 2016-12-19: 14:00:00 via INTRAVENOUS

## 2016-12-19 MED ORDER — FENTANYL CITRATE (PF) 100 MCG/2ML IJ SOLN
INTRAMUSCULAR | Status: DC | PRN
  Administered 2016-12-19 (×5): 50 ug via INTRAVENOUS

## 2016-12-19 MED ORDER — SODIUM CHLORIDE 0.9% FLUSH: 3.0000 mL | INTRAVENOUS | Status: DC | PRN

## 2016-12-19 MED ORDER — SENNOSIDES-DOCUSATE SODIUM 8.6-50 MG PO TABS
2.0000 | ORAL_TABLET | Freq: Every day | ORAL | Status: DC
  Administered 2016-12-19 – 2016-12-20 (×2): 2 via ORAL
  Filled 2016-12-19 (×2): qty 2

## 2016-12-19 MED ORDER — CYCLOBENZAPRINE HCL 10 MG PO TABS
10.0000 mg | ORAL_TABLET | Freq: Three times a day (TID) | ORAL | Status: DC | PRN
  Administered 2016-12-19: 10 mg via ORAL

## 2016-12-19 MED ORDER — PROPOFOL 10 MG/ML IV BOLUS
INTRAVENOUS | Status: AC
  Filled 2016-12-19: qty 20

## 2016-12-19 MED ORDER — ACETAMINOPHEN 325 MG PO TABS: 650.0000 mg | ORAL_TABLET | ORAL | Status: DC | PRN

## 2016-12-19 MED ORDER — BACITRACIN ZINC 500 UNIT/GM EX OINT
TOPICAL_OINTMENT | CUTANEOUS | Status: AC
  Filled 2016-12-19: qty 28.35

## 2016-12-19 MED ORDER — SODIUM CHLORIDE 0.9 % IR SOLN
Status: DC | PRN
  Administered 2016-12-19: 13:00:00

## 2016-12-19 MED ORDER — MECLIZINE HCL 12.5 MG PO TABS: 25.0000 mg | ORAL_TABLET | Freq: Four times a day (QID) | ORAL | Status: DC | PRN

## 2016-12-19 MED ORDER — SUGAMMADEX SODIUM 200 MG/2ML IV SOLN
INTRAVENOUS | Status: AC
  Filled 2016-12-19: qty 2

## 2016-12-19 MED ORDER — CYCLOBENZAPRINE HCL 10 MG PO TABS
ORAL_TABLET | ORAL | Status: AC
  Administered 2016-12-19: 10 mg via ORAL
  Filled 2016-12-19: qty 1

## 2016-12-19 MED ORDER — ROCURONIUM BROMIDE 100 MG/10ML IV SOLN
INTRAVENOUS | Status: DC | PRN
  Administered 2016-12-19: 10 mg via INTRAVENOUS
  Administered 2016-12-19: 50 mg via INTRAVENOUS
  Administered 2016-12-19 (×3): 10 mg via INTRAVENOUS

## 2016-12-19 MED ORDER — ONDANSETRON HCL 4 MG/2ML IJ SOLN
INTRAMUSCULAR | Status: AC
  Filled 2016-12-19: qty 2

## 2016-12-19 MED ORDER — HYDROCODONE-ACETAMINOPHEN 5-325 MG PO TABS
1.0000 | ORAL_TABLET | Freq: Four times a day (QID) | ORAL | Status: DC | PRN
  Administered 2016-12-19: 1 via ORAL
  Filled 2016-12-19 (×2): qty 1

## 2016-12-19 MED ORDER — DEXAMETHASONE SODIUM PHOSPHATE 10 MG/ML IJ SOLN
INTRAMUSCULAR | Status: AC
  Filled 2016-12-19: qty 1

## 2016-12-19 MED ORDER — LACTATED RINGERS IV SOLN
INTRAVENOUS | Status: DC | PRN
  Administered 2016-12-19 (×3): via INTRAVENOUS

## 2016-12-19 MED ORDER — GELATIN ABSORBABLE MT POWD
OROMUCOSAL | Status: DC | PRN
  Administered 2016-12-19: 14:00:00 via TOPICAL

## 2016-12-19 MED ORDER — SODIUM CHLORIDE 0.9 % IV SOLN: 250.0000 mL | INTRAVENOUS | Status: DC

## 2016-12-19 MED ORDER — DEXAMETHASONE SODIUM PHOSPHATE 10 MG/ML IJ SOLN
10.0000 mg | INTRAMUSCULAR | Status: AC
  Administered 2016-12-19: 10 mg via INTRAVENOUS

## 2016-12-19 MED ORDER — VANCOMYCIN HCL IN DEXTROSE 1-5 GM/200ML-% IV SOLN
INTRAVENOUS | Status: AC
  Filled 2016-12-19: qty 200

## 2016-12-19 MED ORDER — VANCOMYCIN HCL IN DEXTROSE 1-5 GM/200ML-% IV SOLN
1000.0000 mg | INTRAVENOUS | Status: AC
  Administered 2016-12-19: 1000 mg via INTRAVENOUS

## 2016-12-19 MED ORDER — OXYCODONE HCL 5 MG PO TABS
5.0000 mg | ORAL_TABLET | Freq: Four times a day (QID) | ORAL | Status: DC | PRN
  Administered 2016-12-19 – 2016-12-20 (×2): 5 mg via ORAL
  Filled 2016-12-19 (×2): qty 1

## 2016-12-19 MED ORDER — BUPIVACAINE HCL (PF) 0.25 % IJ SOLN
INTRAMUSCULAR | Status: DC | PRN
  Administered 2016-12-19: 3.5 mL

## 2016-12-19 MED ORDER — CARVEDILOL 12.5 MG PO TABS
ORAL_TABLET | ORAL | Status: AC
  Filled 2016-12-19: qty 1

## 2016-12-19 MED ORDER — HYDROMORPHONE HCL 1 MG/ML IJ SOLN: 0.5000 mg | INTRAMUSCULAR | Status: DC | PRN

## 2016-12-19 MED ORDER — DIPHENHYDRAMINE HCL 25 MG PO CAPS: 25.0000 mg | ORAL_CAPSULE | Freq: Every evening | ORAL | Status: DC | PRN

## 2016-12-19 MED ORDER — ONDANSETRON HCL 4 MG PO TABS: 4.0000 mg | ORAL_TABLET | Freq: Four times a day (QID) | ORAL | Status: DC | PRN

## 2016-12-19 MED ORDER — ONDANSETRON HCL 4 MG/2ML IJ SOLN
INTRAMUSCULAR | Status: DC | PRN
  Administered 2016-12-19: 4 mg via INTRAVENOUS

## 2016-12-19 MED ORDER — GLYCOPYRROLATE 0.2 MG/ML IJ SOLN
INTRAMUSCULAR | Status: DC | PRN
  Administered 2016-12-19: 0.2 mg via INTRAVENOUS

## 2016-12-19 MED ORDER — DIAZEPAM 5 MG PO TABS
5.0000 mg | ORAL_TABLET | Freq: Four times a day (QID) | ORAL | Status: DC | PRN
  Administered 2016-12-20 – 2016-12-21 (×5): 5 mg via ORAL
  Filled 2016-12-19 (×5): qty 1

## 2016-12-19 MED ORDER — THROMBIN 5000 UNITS EX SOLR
CUTANEOUS | Status: AC
  Filled 2016-12-19: qty 5000

## 2016-12-19 MED ORDER — CITALOPRAM HYDROBROMIDE 10 MG PO TABS
10.0000 mg | ORAL_TABLET | Freq: Every day | ORAL | Status: DC
  Administered 2016-12-19 – 2016-12-21 (×3): 10 mg via ORAL
  Filled 2016-12-19 (×3): qty 1

## 2016-12-19 SURGICAL SUPPLY — 71 items
BAG DECANTER FOR FLEXI CONT (MISCELLANEOUS) ×2 IMPLANT
BENZOIN TINCTURE PRP APPL 2/3 (GAUZE/BANDAGES/DRESSINGS) ×2 IMPLANT
BIT DRILL 3.5 SHORT (BIT) ×2 IMPLANT
BIT DRILL 4.0 SHORT (BIT) ×2 IMPLANT
BLADE CLIPPER SURG (BLADE) ×2 IMPLANT
BLADE SURG 11 STRL SS (BLADE) ×2 IMPLANT
BUR MATCHSTICK NEURO 3.0 LAGG (BURR) ×2 IMPLANT
CANISTER SUCT 3000ML PPV (MISCELLANEOUS) ×2 IMPLANT
CAP LOCKING (Cap) ×2 IMPLANT
CARTRIDGE OIL MAESTRO DRILL (MISCELLANEOUS) ×1 IMPLANT
DECANTER SPIKE VIAL GLASS SM (MISCELLANEOUS) ×2 IMPLANT
DERMABOND ADVANCED (GAUZE/BANDAGES/DRESSINGS) ×1
DERMABOND ADVANCED .7 DNX12 (GAUZE/BANDAGES/DRESSINGS) ×1 IMPLANT
DIFFUSER DRILL AIR PNEUMATIC (MISCELLANEOUS) ×2 IMPLANT
DRAPE C-ARM 42X72 X-RAY (DRAPES) ×6 IMPLANT
DRAPE LAPAROTOMY 100X72 PEDS (DRAPES) ×2 IMPLANT
DRAPE MICROSCOPE LEICA (MISCELLANEOUS) ×2 IMPLANT
DRAPE POUCH INSTRU U-SHP 10X18 (DRAPES) ×2 IMPLANT
DRAPE SURG 17X23 STRL (DRAPES) ×8 IMPLANT
DURAPREP 26ML APPLICATOR (WOUND CARE) ×2 IMPLANT
ELECT REM PT RETURN 9FT ADLT (ELECTROSURGICAL) ×2
ELECTRODE REM PT RTRN 9FT ADLT (ELECTROSURGICAL) ×1 IMPLANT
EVACUATOR 1/8 PVC DRAIN (DRAIN) ×2 IMPLANT
GAUZE SPONGE 4X4 12PLY STRL (GAUZE/BANDAGES/DRESSINGS) ×2 IMPLANT
GAUZE SPONGE 4X4 16PLY XRAY LF (GAUZE/BANDAGES/DRESSINGS) IMPLANT
GLOVE BIO SURGEON STRL SZ7 (GLOVE) ×2 IMPLANT
GLOVE BIO SURGEON STRL SZ8 (GLOVE) ×2 IMPLANT
GLOVE BIOGEL PI IND STRL 7.0 (GLOVE) ×2 IMPLANT
GLOVE BIOGEL PI INDICATOR 7.0 (GLOVE) ×2
GLOVE EXAM NITRILE LRG STRL (GLOVE) IMPLANT
GLOVE EXAM NITRILE XL STR (GLOVE) IMPLANT
GLOVE EXAM NITRILE XS STR PU (GLOVE) IMPLANT
GLOVE INDICATOR 7.5 STRL GRN (GLOVE) ×6 IMPLANT
GLOVE INDICATOR 8.5 STRL (GLOVE) ×2 IMPLANT
GLOVE SS N UNI LF 7.0 STRL (GLOVE) ×2 IMPLANT
GOWN STRL REUS W/ TWL LRG LVL3 (GOWN DISPOSABLE) ×2 IMPLANT
GOWN STRL REUS W/ TWL XL LVL3 (GOWN DISPOSABLE) ×2 IMPLANT
GOWN STRL REUS W/TWL 2XL LVL3 (GOWN DISPOSABLE) ×2 IMPLANT
GOWN STRL REUS W/TWL LRG LVL3 (GOWN DISPOSABLE) ×2
GOWN STRL REUS W/TWL XL LVL3 (GOWN DISPOSABLE) ×2
HEMOSTAT POWDER SURGIFOAM 1G (HEMOSTASIS) ×2 IMPLANT
KIT BASIN OR (CUSTOM PROCEDURE TRAY) ×2 IMPLANT
KIT ROOM TURNOVER OR (KITS) ×2 IMPLANT
LOCKING CAP (Cap) ×4 IMPLANT
MARKER SKIN DUAL TIP RULER LAB (MISCELLANEOUS) ×2 IMPLANT
NEEDLE HYPO 25X1 1.5 SAFETY (NEEDLE) ×2 IMPLANT
NEEDLE SPNL 20GX3.5 QUINCKE YW (NEEDLE) ×2 IMPLANT
NS IRRIG 1000ML POUR BTL (IV SOLUTION) ×2 IMPLANT
OIL CARTRIDGE MAESTRO DRILL (MISCELLANEOUS) ×2
PACK LAMINECTOMY NEURO (CUSTOM PROCEDURE TRAY) ×2 IMPLANT
PAD ARMBOARD 7.5X6 YLW CONV (MISCELLANEOUS) ×6 IMPLANT
PATTIES SURGICAL 1X1 (DISPOSABLE) ×2 IMPLANT
PIN MAYFIELD SKULL DISP (PIN) ×2 IMPLANT
PUTTY BONE DBX 5CC MIX (Putty) ×2 IMPLANT
ROD QUARTEX 4.0X40 (Rod) ×2 IMPLANT
RUBBERBAND STERILE (MISCELLANEOUS) ×4 IMPLANT
SCREW 4.0X18MM (Screw) ×2 IMPLANT
SCREW POLYAXIAL QUARTEX 4X14 (Screw) ×2 IMPLANT
SPONGE LAP 4X18 X RAY DECT (DISPOSABLE) ×2 IMPLANT
SPONGE SURGIFOAM ABS GEL 100 (HEMOSTASIS) ×4 IMPLANT
STRIP CLOSURE SKIN 1/2X4 (GAUZE/BANDAGES/DRESSINGS) ×2 IMPLANT
SUT ETHILON 4 0 PS 2 18 (SUTURE) IMPLANT
SUT VIC AB 0 CT1 18XCR BRD8 (SUTURE) ×1 IMPLANT
SUT VIC AB 0 CT1 8-18 (SUTURE) ×1
SUT VIC AB 2-0 CT1 18 (SUTURE) ×2 IMPLANT
SUT VIC AB 3-0 SH 8-18 (SUTURE) ×2 IMPLANT
SUT VIC AB 4-0 PS2 27 (SUTURE) ×2 IMPLANT
TOWEL GREEN STERILE (TOWEL DISPOSABLE) ×2 IMPLANT
TOWEL GREEN STERILE FF (TOWEL DISPOSABLE) ×2 IMPLANT
TRAY FOLEY W/METER SILVER 16FR (SET/KITS/TRAYS/PACK) ×4 IMPLANT
WATER STERILE IRR 1000ML POUR (IV SOLUTION) ×2 IMPLANT

## 2016-12-19 NOTE — Progress Notes (Signed)
Pharmacy Antibiotic Note  Sierra Leach is a 66 y.o. female admitted on 12/19/2016 for spinal surgery.  Pharmacy has been consulted for vancomycin dosing for surgical prophylaxis in a patient with a drain.  Noted that patient received pre-op vancomycin around 1300 today.  Baseline labs reviewed.   Plan: - Vanc 750mg  IV Q12H, start tomorrow - Monitor renal fxn, clinical progress, vanc trough if indicated - F/U abx LOT - BMET in AM - F/U resume Coumadin   Height: 5\' 3"  (160 cm) Weight: 221 lb 3.2 oz (100.3 kg) IBW/kg (Calculated) : 52.4  Temp (24hrs), Avg:97.6 F (36.4 C), Min:97.1 F (36.2 C), Max:98 F (36.7 C)   Recent Labs Lab 12/14/16 1040  WBC 5.1  CREATININE 0.97    Estimated Creatinine Clearance: 67.1 mL/min (by C-G formula based on SCr of 0.97 mg/dL).    Allergies  Allergen Reactions  . Penicillins Hives, Itching and Rash    Has patient had a PCN reaction causing immediate rash, facial/tongue/throat swelling, SOB or lightheadedness with hypotension: Yes Has patient had a PCN reaction causing severe rash involving mucus membranes or skin necrosis: No Has patient had a PCN reaction that required hospitalization: No Has patient had a PCN reaction occurring within the last 10 years: No If all of the above answers are "NO", then may proceed with Cephalosporin use.   . Sulfa Antibiotics Other (See Comments)    Breaks mouth out in blisters  . Adhesive [Tape] Rash     Vanc 6/13 >>    Finnis Colee D. Laney Potashang, PharmD, BCPS Pager:  [phone] 12/19/2016, 4:57 PM

## 2016-12-19 NOTE — Transfer of Care (Signed)
Immediate Anesthesia Transfer of Care Note  Patient: Sierra Leach  Procedure(s) Performed: Procedure(s): Posterior Cervical Fusion with lateral mass fixation - Cervical Seven-Thoracic one with Diskectomy left (Left)  Patient Location: PACU  Anesthesia Type:General  Level of Consciousness: awake, alert  and oriented  Airway & Oxygen Therapy: Patient Spontanous Breathing and Patient connected to nasal cannula oxygen  Post-op Assessment: Report given to RN, Post -op Vital signs reviewed and stable and Patient moving all extremities X 4  Post vital signs: Reviewed and stable  Last Vitals:  Vitals:   12/19/16 1059 12/19/16 1552  BP: (!) 168/96 137/86  Pulse: (!) 58 93  Resp: 20 17  Temp: 36.7 C 36.2 C    Last Pain:  Vitals:   12/19/16 1059  TempSrc: Oral  PainSc:       Patients Stated Pain Goal: 2 (12/19/16 1058)  Complications: No apparent anesthesia complications

## 2016-12-19 NOTE — Anesthesia Preprocedure Evaluation (Addendum)
Anesthesia Evaluation  Patient identified by MRN, date of birth, ID band Patient awake    History of Anesthesia Complications (+) PONV and history of anesthetic complications  Airway Mallampati: III  TM Distance: <3 FB Neck ROM: Limited    Dental no notable dental hx. (+) Teeth Intact, Dental Advisory Given   Pulmonary shortness of breath,    breath sounds clear to auscultation       Cardiovascular hypertension, + Peripheral Vascular Disease   Rhythm:Regular Rate:Normal     Neuro/Psych CVA    GI/Hepatic GERD  ,  Endo/Other  Morbid obesity  Renal/GU      Musculoskeletal  (+) Arthritis ,   Abdominal (+) + obese,   Peds  Hematology   Anesthesia Other Findings   Reproductive/Obstetrics                           Anesthesia Physical Anesthesia Plan  ASA: III  Anesthesia Plan: General   Post-op Pain Management:    Induction:   PONV Risk Score and Plan: 4 or greater and Ondansetron, Dexamethasone, Propofol and Midazolam  Airway Management Planned:   Additional Equipment:   Intra-op Plan:   Post-operative Plan: Extubation in OR  Informed Consent: I have reviewed the patients History and Physical, chart, labs and discussed the procedure including the risks, benefits and alternatives for the proposed anesthesia with the patient or authorized representative who has indicated his/her understanding and acceptance.   Dental advisory given  Plan Discussed with: CRNA  Anesthesia Plan Comments:         Anesthesia Quick Evaluation

## 2016-12-19 NOTE — H&P (Signed)
Sierra Leach is an 66 y.o. female.   Chief Complaint: Neck and left arm pain HPI: Patient is a 66 year old female is a long-standing cervical lumbar spondylosis has had progressive worsening interscapular pain with radiation numbness tingling or weakness in her left hand. Workup has revealed severe cervical spondylosis with severe foraminal stenosis at C7-T1. Due to patient's failure conservative treatment imaging findings and progression of clinical syndrome I recommended decompressive foraminotomies laminectomy at C7-T1 I extensively reviewed the risks and benefits of the operation with her as well as perioperative course expectations of outcome and alternatives of surgery she understands and agrees to proceed forward.  Past Medical History:  Diagnosis Date  . Anxiety   . Arthritis   . GERD (gastroesophageal reflux disease)    occ  . History of kidney stones   . Hypertension   . Lumbar back pain   . Peripheral vascular disease (HCC) 13   pulmonary embolus  . PONV (postoperative nausea and vomiting)   . Shortness of breath dyspnea   . Stroke (HCC) 99   lefe leg weakness    Past Surgical History:  Procedure Laterality Date  . ABDOMINAL HYSTERECTOMY    . ANTERIOR CERVICAL DECOMP/DISCECTOMY FUSION N/A 02/06/2016   Procedure: ANTERIOR CERVICAL DECOMPRESSION FUSION CERVICAL THREE-FOUR WITH REMOVAL OF PLATE CERVICAL FOUR-SEVEN  ;  Surgeon: Donalee Citrin, MD;  Location: [facility];  Service: Neurosurgery;  Laterality: N/A;  . BACK SURGERY     x8  . bone spurs     multiple  . BREAST SURGERY Left    lumpectomy x3  . COLONOSCOPY    . LUMBAR WOUND DEBRIDEMENT N/A 09/04/2013   Procedure: irrigation and debridement of lumbar wound;  Surgeon: Gwynne Edinger, MD;  Location: [facility];  Service: Neurosurgery;  Laterality: N/A;  irrigation and debridement of lumbar wound  . SHOULDER ARTHROSCOPY Left 03   rotatoer cuff  . SHOULDER ARTHROSCOPY Right 99   bone spur    Family History  Problem  Relation Age of Onset  . High arches Mother   . Neuropathy Mother   . COPD Father   . High blood pressure Sister   . Diabetes Brother   . Parkinson's disease Brother    Social History:  reports that she has never smoked. She has never used smokeless tobacco. She reports that she does not drink alcohol or use drugs.  Allergies:  Allergies  Allergen Reactions  . Penicillins Hives, Itching and Rash    Has patient had a PCN reaction causing immediate rash, facial/tongue/throat swelling, SOB or lightheadedness with hypotension: Yes Has patient had a PCN reaction causing severe rash involving mucus membranes or skin necrosis: No Has patient had a PCN reaction that required hospitalization: No Has patient had a PCN reaction occurring within the last 10 years: No If all of the above answers are "NO", then may proceed with Cephalosporin use.   . Sulfa Antibiotics Other (See Comments)    Breaks mouth out in blisters  . Adhesive [Tape] Rash    Medications Prior to Admission  Medication Sig Dispense Refill  . acetaminophen (TYLENOL) 500 MG tablet Take 1,500 mg by mouth daily as needed for mild pain.    Marland Kitchen allopurinol (ZYLOPRIM) 100 MG tablet Take 100 mg by mouth at bedtime.     . carvedilol (COREG) 12.5 MG tablet Take 12.5 mg by mouth 2 (two) times daily with a meal.    . citalopram (CELEXA) 10 MG tablet Take 10 mg by mouth  daily.    . diazepam (VALIUM) 5 MG tablet Take 5 mg by mouth every 6 (six) hours as needed for anxiety.     . diphenhydrAMINE (BENADRYL) 25 mg capsule Take 25 mg by mouth at bedtime as needed for allergies or sleep.    Marland Kitchen. HYDROcodone-acetaminophen (NORCO/VICODIN) 5-325 MG tablet Take 1 tablet by mouth every 6 (six) hours as needed for pain.  0  . meclizine (ANTIVERT) 25 MG tablet Take 25 mg by mouth every 6 (six) hours as needed for dizziness.    Marland Kitchen. oxycodone (OXY-IR) 5 MG capsule Take 5 mg by mouth every 6 (six) hours as needed.    . sennosides-docusate sodium (SENOKOT-S)  8.6-50 MG tablet Take 2 tablets by mouth at bedtime.    Marland Kitchen. tiZANidine (ZANAFLEX) 2 MG tablet Take 2 mg by mouth every 4 (four) hours as needed (back pain).   3  . warfarin (COUMADIN) 3 MG tablet Take 3-6 mg by mouth at bedtime. Takes 3mg  at daily at bedtime except on Monday takes 6mg .  1    Results for orders placed or performed during the hospital encounter of 12/19/16 (from the past 48 hour(s))  Protime-INR     Status: None   Collection Time: 12/19/16 10:42 AM  Result Value Ref Range   Prothrombin Time 12.8 11.4 - 15.2 seconds   INR 0.96    No results found.  Review of Systems  Musculoskeletal: Positive for joint pain, myalgias and neck pain.    Blood pressure (!) 168/96, pulse (!) 58, temperature 98 F (36.7 C), temperature source Oral, resp. rate 20, height 5\' 3"  (1.6 m), weight 100.3 kg (221 lb 3.2 oz), SpO2 98 %. Physical Exam  Constitutional: She is oriented to person, place, and time. She appears well-developed.  HENT:  Head: Normocephalic.  Eyes: Pupils are equal, round, and reactive to light.  Neck: Normal range of motion.  Cardiovascular: Normal rate.   GI: Soft.  Neurological: She is alert and oriented to person, place, and time. She has normal strength.  Strength is 5 out of 5 deltoid, bicep, tricep, wrist flexion, wrist extension, hand intrinsics.  Skin: Skin is warm and dry.     Assessment/Plan 66 year old female presents for a decompressive cervical laminectomy and foraminotomies at C7-T1.  Obrien Huskins P, MD 12/19/2016, 11:40 AM

## 2016-12-19 NOTE — Anesthesia Postprocedure Evaluation (Signed)
Anesthesia Post Note  Patient: Sierra Leach  Procedure(s) Performed: Procedure(s) (LRB): Posterior Cervical Fusion with lateral mass fixation - Cervical Seven-Thoracic one with Diskectomy left (Left)     Patient location during evaluation: PACU Anesthesia Type: General Level of consciousness: awake, awake and alert and oriented Pain management: pain level controlled Vital Signs Assessment: post-procedure vital signs reviewed and stable Respiratory status: spontaneous breathing, nonlabored ventilation and respiratory function stable Cardiovascular status: blood pressure returned to baseline Anesthetic complications: no    Last Vitals:  Vitals:   12/19/16 1059 12/19/16 1552  BP: (!) 168/96 137/86  Pulse: (!) 58 93  Resp: 20 17  Temp: 36.7 C 36.2 C    Last Pain:  Vitals:   12/19/16 1059  TempSrc: Oral  PainSc:                  Neythan Kozlov COKER

## 2016-12-19 NOTE — Op Note (Signed)
Preoperative diagnosis: Left C8 radiculopathy and cervical spinal stenosis C7-T1.  Postoperative diagnosis: Same  Procedure: Left-sided posterior cervical laminectomy foraminotomies at C7-T1 with lateral mass and pedicle screw fixation from C7-T1 utilizing the globus ellipse screw system. Bilateral posterior lateral arthrodesis C7-T1 utilizing DBX mix  History of present illness: 66 year old female is a progress worsening left shoulder and arm pain rating down to the last 2 fingers of her left and workup revealed cervical stenosis foraminal stenosis worse on the left with a slight listhesis at C7-T1. Due to patient's failed conservative treatment imaging findings and progressive clinical syndrome I recommended a decompressive cervical laminectomy and fusion I extensively went over the risks and benefits of the procedure with her as well as perioperative course excellent patient's of outcome and alternatives of surgery and she understood and agreed to proceed forward. Due to all of her symptoms being on the left I elected to recommend and proceed only with a left-sided foraminotomies decompression and instrument infusion with onlay fusion on the right.  Operative procedure: Patient brought in the or was induced under general anesthesia positioned prone in pins in the neck in slight flexion backside neck was shaved prepped and draped in routine sterile fashion midline incision was made and Bovie left car was used to take down the subcutaneous tissues and subperiosteal dissections care lamina of the was believed to be C7-T1. Extensive amount of paraspinal bleeding during the exposure that eventually came under control. Localization was a big problem has due to her body habitus is for difficult to visualize the C7-T1 disc space on both AP and lateral fluoroscopy. She did have an old fusion from C4-C7 but that hardware and then removed and all she had was hardware remaining at C3-4. Due the extensive amount of  bleeding during the initial dissection did not want dissect further upper neck to count down from C4 however it was also readily apparent that she had prominent spinous processes beginning at C7 and T1 and I could clearly identify the prominence of the C7 spinous process in comparison to smoke much smaller C6 spinous process and confirmed this with her MRI scan and plain x-rays. Utilizing these bony landmarks as well as AP fluoroscopy I can this myself and I was at the correct disc space at C7-T1. I left the options open. Bilateral fixation she was not floridly unstable at that level I elected for unilateral fixation but bilateral posterior lateral arthrodesis so I performed a laminotomy on the left at C7-T1 identified thecal sac identified the T1 pedicle and marched out the C8 foramen. A very large free fragment disc was immediately identified and removed as well as marked facet arthropathy and a partial degenerated synovial cyst. All this was removed and this significantly decompressed the left C8 nerve root. I was easily able to pass a nerve hook and 7 room dissector out the foramen. After adequate foraminotomies been achieved attention second screw placement I placed a lateral mass screw in routine fashion a C7 pedicle screw at T1 postop fluoroscopy utilizing AP fluoroscopy did confirm the stability of the pedicle. Then I aggressively decorticated the lateral masses and facet joints and packed DBX mix bilaterally within the facet joints and along the lateral masses. Then the wound was copious irrigated meticulous he states was maintained I placed a medium Hemovac drain and closed the wound in layers with interrupted Vicryl and a running 4 subcuticular Dermabond Steri-Strips and were applied and patient recovered in stable condition. At the end the case all  needle counts sponge counts were correct.

## 2016-12-19 NOTE — Anesthesia Procedure Notes (Signed)
Procedure Name: Intubation Date/Time: 12/19/2016 12:36 PM Performed by: Lovie CholOCK, Dawud Mays K Pre-anesthesia Checklist: Patient identified, Emergency Drugs available, Suction available and Patient being monitored Patient Re-evaluated:Patient Re-evaluated prior to inductionOxygen Delivery Method: Circle System Utilized Preoxygenation: Pre-oxygenation with 100% oxygen Intubation Type: IV induction Ventilation: Mask ventilation without difficulty and Oral airway inserted - appropriate to patient size Grade View: Grade I Tube type: Oral Tube size: 7.0 mm Number of attempts: 1 Airway Equipment and Method: Stylet,  Oral airway and Video-laryngoscopy Placement Confirmation: ETT inserted through vocal cords under direct vision,  positive ETCO2 and breath sounds checked- equal and bilateral Secured at: 21 cm Tube secured with: Tape Dental Injury: Teeth and Oropharynx as per pre-operative assessment

## 2016-12-20 ENCOUNTER — Encounter (HOSPITAL_COMMUNITY): Payer: Self-pay | Admitting: Neurosurgery

## 2016-12-20 LAB — BASIC METABOLIC PANEL
ANION GAP: 7 (ref 5–15)
BUN: 15 mg/dL (ref 6–20)
CHLORIDE: 104 mmol/L (ref 101–111)
CO2: 26 mmol/L (ref 22–32)
Calcium: 8.3 mg/dL — ABNORMAL LOW (ref 8.9–10.3)
Creatinine, Ser: 1 mg/dL (ref 0.44–1.00)
GFR calc Af Amer: >60 mL/min (ref >60)
GFR calc non Af Amer: 59 mL/min — ABNORMAL LOW (ref >60)
GLUCOSE: 157 mg/dL — AB (ref 65–99)
POTASSIUM: 4.4 mmol/L (ref 3.5–5.1)
Sodium: 137 mmol/L (ref 135–145)

## 2016-12-20 MED ORDER — DEXAMETHASONE SODIUM PHOSPHATE 10 MG/ML IJ SOLN
10.0000 mg | Freq: Four times a day (QID) | INTRAMUSCULAR | Status: AC
  Administered 2016-12-20 (×3): 10 mg via INTRAVENOUS
  Filled 2016-12-20 (×3): qty 1

## 2016-12-20 MED ORDER — OXYCODONE HCL 5 MG PO TABS
5.0000 mg | ORAL_TABLET | ORAL | Status: DC | PRN
  Administered 2016-12-20 – 2016-12-21 (×7): 5 mg via ORAL
  Filled 2016-12-20 (×7): qty 1

## 2016-12-20 MED ORDER — WHITE PETROLATUM GEL
Status: AC
  Administered 2016-12-20: 01:00:00
  Filled 2016-12-20: qty 1

## 2016-12-20 MED FILL — Thrombin For Soln 20000 Unit: CUTANEOUS | Qty: 1 | Status: AC

## 2016-12-20 NOTE — Progress Notes (Signed)
Subjective: Patient reports Condition neck pain  Objective: Vital signs in last 24 hours: Temp:  [97.1 F (36.2 C)-98.6 F (37 C)] 97.6 F (36.4 C) (06/14 0805) Pulse Rate:  [58-96] 68 (06/14 0805) Resp:  [10-27] 18 (06/14 0805) BP: (92-168)/(60-100) 92/60 (06/14 0805) SpO2:  [96 %-100 %] 96 % (06/14 0805) Weight:  [100.3 kg (221 lb 3 oz)-100.3 kg (221 lb 3.2 oz)] 100.3 kg (221 lb 3.2 oz) (06/13 1100)  Intake/Output from previous day: 06/13 0701 - 06/14 0700 In: 2915 [P.O.:440; I.V.:2000; IV Piggyback:400] Out: 860 [Urine:160; Blood:700] Intake/Output this shift: Total I/O In: 360 [P.O.:360] Out: -   Strength out of 5 wound clean dry and intact  Lab Results: No results for input(s): WBC, HGB, HCT, PLT in the last 72 hours. BMET  Recent Labs  12/20/16 0326  NA 137  K 4.4  CL 104  CO2 26  GLUCOSE 157*  BUN 15  CREATININE 1.00  CALCIUM 8.3*    Studies/Results: Dg Cervical Spine 1 View  Result Date: 12/19/2016 CLINICAL DATA:  66 year old female undergoing cervicothoracic junction surgery. EXAM: DG C-ARM 61-120 MIN; DG CERVICAL SPINE - 1 VIEW COMPARISON:  Cervical spine MRI 10/29/2016. FINDINGS: Single Intraoperative fluoroscopic spot view in the anterior projection at the cervicothoracic junction level. Spinal anatomy detail is limited. Left side unilateral posterior approach spinal hardware is demonstrated. FLUOROSCOPY TIME:  0 minutes 29 seconds IMPRESSION: AP fluoroscopic intraoperative spot view. Unilateral left side posterior approach spinal hardware at what appears to be the cervicothoracic junction, although spinal anatomy detail is limited. Electronically Signed   By: Odessa FlemingH  Hall M.D.   On: 12/19/2016 15:33   Dg C-arm 1-60 Min  Result Date: 12/19/2016 CLINICAL DATA:  66 year old female undergoing cervicothoracic junction surgery. EXAM: DG C-ARM 61-120 MIN; DG CERVICAL SPINE - 1 VIEW COMPARISON:  Cervical spine MRI 10/29/2016. FINDINGS: Single Intraoperative  fluoroscopic spot view in the anterior projection at the cervicothoracic junction level. Spinal anatomy detail is limited. Left side unilateral posterior approach spinal hardware is demonstrated. FLUOROSCOPY TIME:  0 minutes 29 seconds IMPRESSION: AP fluoroscopic intraoperative spot view. Unilateral left side posterior approach spinal hardware at what appears to be the cervicothoracic junction, although spinal anatomy detail is limited. Electronically Signed   By: Odessa FlemingH  Hall M.D.   On: 12/19/2016 15:33    Assessment/Plan: Continue to mobilize today and put in the halls we'll give her a couple doses of Decadron for inflammation observed 24 more hours  LOS: 1 day     Sierra Leach P 12/20/2016, 9:57 AM

## 2016-12-21 MED ORDER — HYDROCODONE-ACETAMINOPHEN 5-325 MG PO TABS: 1.0000 | ORAL_TABLET | Freq: Four times a day (QID) | ORAL | 0 refills | Status: DC | PRN

## 2016-12-21 NOTE — Discharge Instructions (Signed)

## 2016-12-21 NOTE — Discharge Summary (Signed)
Physician Discharge Summary  Patient ID: Sierra Leach MRN: [id number] DOB/AGE: 1950/11/18 66 y.o.  Admit date: 12/19/2016 Discharge date: 12/21/2016  Admission Diagnoses: cervical spondylosis with severe foraminal stenosis C7-T1   Discharge Diagnoses: same as admission diagnosis   Discharged Condition: good  Hospital Course: The patient was admitted on 12/19/2016 and taken to the operating room where the patient underwent a decompressive foraminotomies laminectomy at C7-T1  . The patient tolerated the procedure well and was taken to the recovery room and then to the floor in stable condition. The hospital course was routine. There were no complications. The wound remained clean dry and intact. Pt had appropriate beck soreness. No complaints of extreme pain or new N/T/W. The patient remained afebrile with stable vital signs, and tolerated a regular diet. The patient continued to increase activities, and pain was well controlled with oral pain medications.   Consults: None  Significant Diagnostic Studies:  Results for orders placed or performed during the hospital encounter of 12/19/16  Protime-INR  Result Value Ref Range   Prothrombin Time 12.8 11.4 - 15.2 seconds   INR 0.96   Basic metabolic panel  Result Value Ref Range   Sodium 137 135 - 145 mmol/L   Potassium 4.4 3.5 - 5.1 mmol/L   Chloride 104 101 - 111 mmol/L   CO2 26 22 - 32 mmol/L   Glucose, Bld 157 (H) 65 - 99 mg/dL   BUN 15 6 - 20 mg/dL   Creatinine, Ser 1.91 0.44 - 1.00 mg/dL   Calcium 8.3 (L) 8.9 - 10.3 mg/dL   GFR calc non Af Amer 59 (L) >60 mL/min   GFR calc Af Amer >60 >60 mL/min   Anion gap 7 5 - 15    Dg Cervical Spine 1 View  Result Date: 12/19/2016 CLINICAL DATA:  66 year old female undergoing cervicothoracic junction surgery. EXAM: DG C-ARM 61-120 MIN; DG CERVICAL SPINE - 1 VIEW COMPARISON:  Cervical spine MRI 10/29/2016. FINDINGS: Single Intraoperative fluoroscopic spot view in the anterior projection at  the cervicothoracic junction level. Spinal anatomy detail is limited. Left side unilateral posterior approach spinal hardware is demonstrated. FLUOROSCOPY TIME:  0 minutes 29 seconds IMPRESSION: AP fluoroscopic intraoperative spot view. Unilateral left side posterior approach spinal hardware at what appears to be the cervicothoracic junction, although spinal anatomy detail is limited. Electronically Signed   By: Odessa Fleming M.D.   On: 12/19/2016 15:33   Dg C-arm 1-60 Min  Result Date: 12/19/2016 CLINICAL DATA:  66 year old female undergoing cervicothoracic junction surgery. EXAM: DG C-ARM 61-120 MIN; DG CERVICAL SPINE - 1 VIEW COMPARISON:  Cervical spine MRI 10/29/2016. FINDINGS: Single Intraoperative fluoroscopic spot view in the anterior projection at the cervicothoracic junction level. Spinal anatomy detail is limited. Left side unilateral posterior approach spinal hardware is demonstrated. FLUOROSCOPY TIME:  0 minutes 29 seconds IMPRESSION: AP fluoroscopic intraoperative spot view. Unilateral left side posterior approach spinal hardware at what appears to be the cervicothoracic junction, although spinal anatomy detail is limited. Electronically Signed   By: Odessa Fleming M.D.   On: 12/19/2016 15:33    Antibiotics:  Anti-infectives    Start     Dose/Rate Route Frequency Ordered Stop   12/20/16 0600  vancomycin (VANCOCIN) IVPB 1000 mg/200 mL premix     1,000 mg 200 mL/hr over 60 Minutes Intravenous On call to O.R. 12/19/16 1211 12/19/16 1310   12/20/16 0100  vancomycin (VANCOCIN) IVPB 750 mg/150 ml premix     750 mg 150 mL/hr over 60 Minutes  Intravenous Every 12 hours 12/19/16 1654     12/19/16 1300  bacitracin 50,000 Units in sodium chloride irrigation 0.9 % 500 mL irrigation  Status:  Discontinued       As needed 12/19/16 1405 12/19/16 1549   12/19/16 1230  vancomycin (VANCOCIN) 500 mg in sodium chloride 0.9 % 100 mL IVPB     500 mg 100 mL/hr over 60 Minutes Intravenous To Surgery 12/19/16 1227  12/19/16 1411   12/19/16 1212  vancomycin (VANCOCIN) 1-5 GM/200ML-% IVPB    Comments:  Romie Minusock, Jennifer   : cabinet override      12/19/16 1212 12/19/16 1210      Discharge Exam: Blood pressure (!) 144/85, pulse 60, temperature 97.5 F (36.4 C), resp. rate 20, height 5\' 3"  (1.6 m), weight 100.3 kg (221 lb 3.2 oz), SpO2 100 %. Neurologic: Grossly normal Patient doing well. She is ambulating and voiding well. She has appropriate soreness and burning in her neck. Her left arm pain is gone.   Discharge Medications:   Allergies as of 12/21/2016      Reactions   Penicillins Hives, Itching, Rash   Has patient had a PCN reaction causing immediate rash, facial/tongue/throat swelling, SOB or lightheadedness with hypotension: Yes Has patient had a PCN reaction causing severe rash involving mucus membranes or skin necrosis: No Has patient had a PCN reaction that required hospitalization: No Has patient had a PCN reaction occurring within the last 10 years: No If all of the above answers are "NO", then may proceed with Cephalosporin use.   Sulfa Antibiotics Other (See Comments)   Breaks mouth out in blisters   Adhesive [tape] Rash      Medication List    STOP taking these medications   oxycodone 5 MG capsule Commonly known as:  OXY-IR   warfarin 3 MG tablet Commonly known as:  COUMADIN     TAKE these medications   acetaminophen 500 MG tablet Commonly known as:  TYLENOL Take 1,500 mg by mouth daily as needed for mild pain.   allopurinol 100 MG tablet Commonly known as:  ZYLOPRIM Take 100 mg by mouth at bedtime.   BENADRYL 25 mg capsule Generic drug:  diphenhydrAMINE Take 25 mg by mouth at bedtime as needed for allergies or sleep.   carvedilol 12.5 MG tablet Commonly known as:  COREG Take 12.5 mg by mouth 2 (two) times daily with a meal.   citalopram 10 MG tablet Commonly known as:  CELEXA Take 10 mg by mouth daily.   diazepam 5 MG tablet Commonly known as:  VALIUM Take 5 mg  by mouth every 6 (six) hours as needed for anxiety.   HYDROcodone-acetaminophen 5-325 MG tablet Commonly known as:  NORCO/VICODIN Take 1 tablet by mouth every 6 (six) hours as needed for pain. What changed:  Another medication with the same name was added. Make sure you understand how and when to take each.   HYDROcodone-acetaminophen 5-325 MG tablet Commonly known as:  NORCO/VICODIN Take 1 tablet by mouth every 6 (six) hours as needed for moderate pain. What changed:  You were already taking a medication with the same name, and this prescription was added. Make sure you understand how and when to take each.   meclizine 25 MG tablet Commonly known as:  ANTIVERT Take 25 mg by mouth every 6 (six) hours as needed for dizziness.   sennosides-docusate sodium 8.6-50 MG tablet Commonly known as:  SENOKOT-S Take 2 tablets by mouth at bedtime.   tiZANidine 2  MG tablet Commonly known as:  ZANAFLEX Take 2 mg by mouth every 4 (four) hours as needed (back pain).       Disposition: home   Final Dx: same as admitting  Discharge Instructions    (HEART FAILURE PATIENTS) Call MD:  Anytime you have any of the following symptoms: 1) 3 pound weight gain in 24 hours or 5 pounds in 1 week 2) shortness of breath, with or without a dry hacking cough 3) swelling in the hands, feet or stomach 4) if you have to sleep on extra pillows at night in order to breathe.    Complete by:  As directed    Call MD for:    Complete by:  As directed    Call MD for:  difficulty breathing, headache or visual disturbances    Complete by:  As directed    Call MD for:  extreme fatigue    Complete by:  As directed    Call MD for:  hives    Complete by:  As directed    Call MD for:  persistant dizziness or light-headedness    Complete by:  As directed    Call MD for:  persistant nausea and vomiting    Complete by:  As directed    Call MD for:  redness, tenderness, or signs of infection (pain, swelling, redness, odor or  green/yellow discharge around incision site)    Complete by:  As directed    Call MD for:  severe uncontrolled pain    Complete by:  As directed    Call MD for:  temperature >100.4    Complete by:  As directed    Diet - low sodium heart healthy    Complete by:  As directed    Increase activity slowly    Complete by:  As directed       Follow-up Information    Donalee Citrin, MD Follow up in 2 week(s).   Specialty:  Neurosurgery Contact information: [address] [place] [address] [phone]          Patient doing well with some moderate pain that she states is tolerable. Will discharge home and follow up in the office in 2 weeks.   Signed: Tiana Loft Kaelei Wheeler 12/21/2016, 9:30 AM

## 2016-12-21 NOTE — Progress Notes (Signed)
Pt given D/C instructions with Rx, verbal understanding was provided. Pt's IV and Hemovac were removed prior to D/C. Pt's incision was open to air with steri-strips and has no sign of infection. Pt D/C'd home via wheelchair @ 1105 per MD order. Pt is stable @ D/C and has no other needs at this time. Rema FendtAshley Prem Coykendall, RN

## 2017-03-05 ENCOUNTER — Other Ambulatory Visit (HOSPITAL_BASED_OUTPATIENT_CLINIC_OR_DEPARTMENT_OTHER): Payer: Self-pay | Admitting: Neurosurgery

## 2017-03-05 DIAGNOSIS — M542 Cervicalgia: Secondary | ICD-10-CM

## 2017-03-08 ENCOUNTER — Ambulatory Visit (HOSPITAL_BASED_OUTPATIENT_CLINIC_OR_DEPARTMENT_OTHER)
Admission: RE | Admit: 2017-03-08 | Discharge: 2017-03-08 | Disposition: A | Discharge status: Home / Self Care | Payer: Medicare HMO | Source: Ambulatory Visit | Attending: Neurosurgery | Admitting: Neurosurgery

## 2017-03-08 DIAGNOSIS — M4322 Fusion of spine, cervical region: Secondary | ICD-10-CM | POA: Diagnosis not present

## 2017-03-08 DIAGNOSIS — M542 Cervicalgia: Secondary | ICD-10-CM | POA: Diagnosis present

## 2017-05-06 ENCOUNTER — Other Ambulatory Visit: Payer: Self-pay | Admitting: Neurosurgery

## 2017-05-10 NOTE — Pre-Procedure Instructions (Signed)
Sierra Leach  05/10/2017      [facility] - [place], [place] - [address] AT [facility] OF [address] [place] [address] Phone: [phone] Fax: [phone]  [id number] - [place], [place] - [address] [place] [address] Phone: [phone] Fax: [phone]    Your procedure is scheduled on November 7  Report to [facility] Admitting at 1235 A.M.  Call this number if you have problems the morning of surgery:  [phone]   Remember:  Do not eat food or drink liquids after midnight.  Continue all other medications as directed by your physician except follow these medication instructions before surgery   Take these medicines the morning of surgery with A SIP OF WATER  acetaminophen (TYLENOL)  carvedilol (COREG) citalopram (CELEXA) HYDROcodone-acetaminophen (NORCO/VICODIN) meclizine (ANTIVERT)  7 days prior to surgery STOP taking any Aspirin (unless otherwise instructed by your surgeon), Aleve, Naproxen, Ibuprofen, Motrin, Advil, Goody's, BC's, all herbal medications, fish oil, and all vitamins  FOLLOW PHYSICIANS INSTRUCTIONS ABOUT COUMADIN   Do not wear jewelry, make-up or nail polish.  Do not wear lotions, powders, or perfumes, or deoderant.  Do not shave 48 hours prior to surgery.  Men may shave face and neck.  Do not bring valuables to the hospital.  [facility] is not responsible for any belongings or valuables.  Contacts, dentures or bridgework may not be worn into surgery.  Leave your suitcase in the car.  After surgery it may be brought to your room.  For patients admitted to the hospital, discharge time will be determined by your treatment team.  Patients discharged the day of surgery will not be allowed to drive home.    Special instructions:   [place]- Preparing For Surgery  Before surgery, you can play an important role. Because skin is not sterile, your skin  needs to be as free of germs as possible. You can reduce the number of germs on your skin by washing with CHG (chlorahexidine gluconate) Soap before surgery.  CHG is an antiseptic cleaner which kills germs and bonds with the skin to continue killing germs even after washing.  Please do not use if you have an allergy to CHG or antibacterial soaps. If your skin becomes reddened/irritated stop using the CHG.  Do not shave (including legs and underarms) for at least 48 hours prior to first CHG shower. It is OK to shave your face.  Please follow these instructions carefully.   1. Shower the NIGHT BEFORE SURGERY and the MORNING OF SURGERY with CHG.   2. If you chose to wash your hair, wash your hair first as usual with your normal shampoo.  3. After you shampoo, rinse your hair and body thoroughly to remove the shampoo.  4. Use CHG as you would any other liquid soap. You can apply CHG directly to the skin and wash gently with a scrungie or a clean washcloth.   5. Apply the CHG Soap to your body ONLY FROM THE NECK DOWN.  Do not use on open wounds or open sores. Avoid contact with your eyes, ears, mouth and genitals (private parts). Wash Face and genitals (private parts)  with your normal soap.  6. Wash thoroughly, paying special attention to the area where your surgery will be performed.  7. Thoroughly rinse your body with warm water from the neck down.  8. DO NOT  shower/wash with your normal soap after using and rinsing off the CHG Soap.  9. Pat yourself dry with a CLEAN TOWEL.  10. Wear CLEAN PAJAMAS to bed the night before surgery, wear comfortable clothes the morning of surgery  11. Place CLEAN SHEETS on your bed the night of your first shower and DO NOT SLEEP WITH PETS.    Day of Surgery: Do not apply any deodorants/lotions. Please wear clean clothes to the hospital/surgery center.      Please read over the following fact sheets that you were given.

## 2017-05-13 ENCOUNTER — Encounter (HOSPITAL_COMMUNITY)
Admission: RE | Admit: 2017-05-13 | Discharge: 2017-05-13 | Disposition: A | Discharge status: Home / Self Care | Payer: Medicare HMO | Source: Ambulatory Visit | Attending: Neurosurgery | Admitting: Neurosurgery

## 2017-05-13 ENCOUNTER — Encounter (HOSPITAL_COMMUNITY): Payer: Self-pay

## 2017-05-13 HISTORY — DX: Depression, unspecified: F32.A

## 2017-05-13 HISTORY — DX: Headache, unspecified: R51.9

## 2017-05-13 HISTORY — DX: Headache: R51

## 2017-05-13 HISTORY — DX: Major depressive disorder, single episode, unspecified: F32.9

## 2017-05-13 LAB — BASIC METABOLIC PANEL
ANION GAP: 9 (ref 5–15)
BUN: 13 mg/dL (ref 6–20)
CO2: 23 mmol/L (ref 22–32)
Calcium: 8.9 mg/dL (ref 8.9–10.3)
Chloride: 106 mmol/L (ref 101–111)
Creatinine, Ser: 1.27 mg/dL — ABNORMAL HIGH (ref 0.44–1.00)
GFR calc Af Amer: 51 mL/min — ABNORMAL LOW (ref >60)
GFR calc non Af Amer: 44 mL/min — ABNORMAL LOW (ref >60)
GLUCOSE: 111 mg/dL — AB (ref 65–99)
POTASSIUM: 3.9 mmol/L (ref 3.5–5.1)
Sodium: 138 mmol/L (ref 135–145)

## 2017-05-13 LAB — PROTIME-INR
INR: 1.01
Prothrombin Time: 13.2 seconds (ref 11.4–15.2)

## 2017-05-13 LAB — TYPE AND SCREEN
ABO/RH(D): B POS
Antibody Screen: NEGATIVE

## 2017-05-13 LAB — CBC
HEMATOCRIT: 38.7 % (ref 36.0–46.0)
HEMOGLOBIN: 12 g/dL (ref 12.0–15.0)
MCH: 26 pg (ref 26.0–34.0)
MCHC: 31 g/dL (ref 30.0–36.0)
MCV: 83.9 fL (ref 78.0–100.0)
Platelets: 243 10*3/uL (ref 150–400)
RBC: 4.61 MIL/uL (ref 3.87–5.11)
RDW: 16.4 % — AB (ref 11.5–15.5)
WBC: 4.8 10*3/uL (ref 4.0–10.5)

## 2017-05-13 LAB — SURGICAL PCR SCREEN
MRSA, PCR: NEGATIVE
STAPHYLOCOCCUS AUREUS: NEGATIVE

## 2017-05-13 LAB — APTT: APTT: 26 s (ref 24–36)

## 2017-05-15 ENCOUNTER — Inpatient Hospital Stay (HOSPITAL_COMMUNITY): Payer: Medicare HMO | Admitting: Certified Registered Nurse Anesthetist

## 2017-05-15 ENCOUNTER — Inpatient Hospital Stay (HOSPITAL_COMMUNITY)
Admission: RE | Admit: 2017-05-15 | Discharge: 2017-05-17 | DRG: 472 | Disposition: A | Discharge status: Home / Self Care | Payer: Medicare HMO | Source: Ambulatory Visit | Attending: Neurosurgery | Admitting: Neurosurgery

## 2017-05-15 ENCOUNTER — Encounter (HOSPITAL_COMMUNITY)
Admission: RE | Disposition: A | Discharge status: Home / Self Care | Payer: Self-pay | Source: Ambulatory Visit | Attending: Neurosurgery

## 2017-05-15 ENCOUNTER — Encounter (HOSPITAL_COMMUNITY): Payer: Self-pay | Admitting: Urology

## 2017-05-15 DIAGNOSIS — F329 Major depressive disorder, single episode, unspecified: Secondary | ICD-10-CM | POA: Diagnosis present

## 2017-05-15 DIAGNOSIS — Z7901 Long term (current) use of anticoagulants: Secondary | ICD-10-CM

## 2017-05-15 DIAGNOSIS — Z91048 Other nonmedicinal substance allergy status: Secondary | ICD-10-CM | POA: Diagnosis not present

## 2017-05-15 DIAGNOSIS — T85628A Displacement of other specified internal prosthetic devices, implants and grafts, initial encounter: Secondary | ICD-10-CM | POA: Diagnosis present

## 2017-05-15 DIAGNOSIS — Z880 Allergy status to penicillin: Secondary | ICD-10-CM

## 2017-05-15 DIAGNOSIS — Z881 Allergy status to other antibiotic agents status: Secondary | ICD-10-CM | POA: Diagnosis not present

## 2017-05-15 DIAGNOSIS — Z79899 Other long term (current) drug therapy: Secondary | ICD-10-CM | POA: Diagnosis not present

## 2017-05-15 DIAGNOSIS — Z90710 Acquired absence of both cervix and uterus: Secondary | ICD-10-CM

## 2017-05-15 DIAGNOSIS — Y838 Other surgical procedures as the cause of abnormal reaction of the patient, or of later complication, without mention of misadventure at the time of the procedure: Secondary | ICD-10-CM | POA: Diagnosis present

## 2017-05-15 DIAGNOSIS — I69344 Monoplegia of lower limb following cerebral infarction affecting left non-dominant side: Secondary | ICD-10-CM

## 2017-05-15 DIAGNOSIS — I10 Essential (primary) hypertension: Secondary | ICD-10-CM | POA: Diagnosis present

## 2017-05-15 DIAGNOSIS — M960 Pseudarthrosis after fusion or arthrodesis: Secondary | ICD-10-CM | POA: Diagnosis present

## 2017-05-15 DIAGNOSIS — Z86711 Personal history of pulmonary embolism: Secondary | ICD-10-CM | POA: Diagnosis not present

## 2017-05-15 DIAGNOSIS — S129XXA Fracture of neck, unspecified, initial encounter: Secondary | ICD-10-CM | POA: Diagnosis present

## 2017-05-15 DIAGNOSIS — F419 Anxiety disorder, unspecified: Secondary | ICD-10-CM | POA: Diagnosis present

## 2017-05-15 DIAGNOSIS — K219 Gastro-esophageal reflux disease without esophagitis: Secondary | ICD-10-CM | POA: Diagnosis present

## 2017-05-15 DIAGNOSIS — I739 Peripheral vascular disease, unspecified: Secondary | ICD-10-CM | POA: Diagnosis present

## 2017-05-15 DIAGNOSIS — M542 Cervicalgia: Secondary | ICD-10-CM | POA: Diagnosis present

## 2017-05-15 HISTORY — PX: POSTERIOR CERVICAL FUSION/FORAMINOTOMY: SHX5038

## 2017-05-15 SURGERY — POSTERIOR CERVICAL FUSION/FORAMINOTOMY LEVEL 1
Anesthesia: General | Site: Back

## 2017-05-15 MED ORDER — LACTATED RINGERS IV SOLN
INTRAVENOUS | Status: DC | PRN
  Administered 2017-05-15: 14:00:00 via INTRAVENOUS

## 2017-05-15 MED ORDER — HYDROMORPHONE HCL 1 MG/ML IJ SOLN
INTRAMUSCULAR | Status: AC
  Filled 2017-05-15: qty 1

## 2017-05-15 MED ORDER — LACTATED RINGERS IV SOLN
INTRAVENOUS | Status: DC
  Administered 2017-05-15 (×2): via INTRAVENOUS

## 2017-05-15 MED ORDER — SODIUM CHLORIDE 0.9% FLUSH: 3.0000 mL | INTRAVENOUS | Status: DC | PRN

## 2017-05-15 MED ORDER — VANCOMYCIN HCL 1000 MG IV SOLR
INTRAVENOUS | Status: DC | PRN
  Administered 2017-05-15: 1000 mg via TOPICAL

## 2017-05-15 MED ORDER — DEXAMETHASONE SODIUM PHOSPHATE 10 MG/ML IJ SOLN
INTRAMUSCULAR | Status: AC
  Filled 2017-05-15: qty 2

## 2017-05-15 MED ORDER — MIDAZOLAM HCL 5 MG/5ML IJ SOLN
INTRAMUSCULAR | Status: DC | PRN
  Administered 2017-05-15: 2 mg via INTRAVENOUS

## 2017-05-15 MED ORDER — VANCOMYCIN HCL IN DEXTROSE 1-5 GM/200ML-% IV SOLN
1000.0000 mg | Freq: Once | INTRAVENOUS | Status: AC
  Administered 2017-05-15: 1000 mg via INTRAVENOUS

## 2017-05-15 MED ORDER — BUPIVACAINE HCL (PF) 0.25 % IJ SOLN
INTRAMUSCULAR | Status: AC
Start: 2017-05-15 — End: 2017-05-15
  Filled 2017-05-15: qty 30

## 2017-05-15 MED ORDER — VANCOMYCIN HCL IN DEXTROSE 1-5 GM/200ML-% IV SOLN
INTRAVENOUS | Status: AC
  Filled 2017-05-15: qty 200

## 2017-05-15 MED ORDER — LIDOCAINE-EPINEPHRINE 1 %-1:100000 IJ SOLN
INTRAMUSCULAR | Status: AC
  Filled 2017-05-15: qty 1

## 2017-05-15 MED ORDER — OXYCODONE HCL 5 MG PO TABS
15.0000 mg | ORAL_TABLET | ORAL | Status: DC | PRN
  Administered 2017-05-15 – 2017-05-17 (×9): 15 mg via ORAL
  Filled 2017-05-15 (×10): qty 3

## 2017-05-15 MED ORDER — 0.9 % SODIUM CHLORIDE (POUR BTL) OPTIME
TOPICAL | Status: DC | PRN
  Administered 2017-05-15: 1000 mL

## 2017-05-15 MED ORDER — DEXAMETHASONE SODIUM PHOSPHATE 10 MG/ML IJ SOLN
INTRAMUSCULAR | Status: DC | PRN
  Administered 2017-05-15: 10 mg via INTRAVENOUS

## 2017-05-15 MED ORDER — FENTANYL CITRATE (PF) 250 MCG/5ML IJ SOLN
INTRAMUSCULAR | Status: AC
  Filled 2017-05-15: qty 5

## 2017-05-15 MED ORDER — VANCOMYCIN HCL 1000 MG IV SOLR
INTRAVENOUS | Status: AC
  Filled 2017-05-15: qty 1000

## 2017-05-15 MED ORDER — PHENYLEPHRINE HCL 10 MG/ML IJ SOLN
INTRAVENOUS | Status: DC | PRN
  Administered 2017-05-15: 25 ug/min via INTRAVENOUS

## 2017-05-15 MED ORDER — THROMBIN (RECOMBINANT) 20000 UNITS EX SOLR
CUTANEOUS | Status: DC | PRN
  Administered 2017-05-15: 20 mL via TOPICAL

## 2017-05-15 MED ORDER — SODIUM CHLORIDE 0.9 % IV SOLN: 250.0000 mL | INTRAVENOUS | Status: DC

## 2017-05-15 MED ORDER — EPHEDRINE 5 MG/ML INJ
INTRAVENOUS | Status: AC
  Filled 2017-05-15: qty 10

## 2017-05-15 MED ORDER — LIDOCAINE 2% (20 MG/ML) 5 ML SYRINGE
INTRAMUSCULAR | Status: AC
  Filled 2017-05-15: qty 10

## 2017-05-15 MED ORDER — BACITRACIN ZINC 500 UNIT/GM EX OINT
TOPICAL_OINTMENT | CUTANEOUS | Status: AC
  Filled 2017-05-15: qty 28.35

## 2017-05-15 MED ORDER — PHENOL 1.4 % MT LIQD: 1.0000 | OROMUCOSAL | Status: DC | PRN

## 2017-05-15 MED ORDER — BACITRACIN ZINC 500 UNIT/GM EX OINT
TOPICAL_OINTMENT | CUTANEOUS | Status: DC | PRN
  Administered 2017-05-15: 1 via TOPICAL

## 2017-05-15 MED ORDER — HYDROMORPHONE HCL 1 MG/ML IJ SOLN: 0.5000 mg | INTRAMUSCULAR | Status: DC | PRN

## 2017-05-15 MED ORDER — ROCURONIUM BROMIDE 100 MG/10ML IV SOLN
INTRAVENOUS | Status: DC | PRN
  Administered 2017-05-15: 50 mg via INTRAVENOUS

## 2017-05-15 MED ORDER — ONDANSETRON HCL 4 MG PO TABS: 4.0000 mg | ORAL_TABLET | Freq: Four times a day (QID) | ORAL | Status: DC | PRN

## 2017-05-15 MED ORDER — ACETAMINOPHEN 325 MG PO TABS
650.0000 mg | ORAL_TABLET | ORAL | Status: DC | PRN
  Administered 2017-05-16 – 2017-05-17 (×2): 650 mg via ORAL
  Filled 2017-05-15 (×2): qty 2

## 2017-05-15 MED ORDER — EPHEDRINE SULFATE-NACL 50-0.9 MG/10ML-% IV SOSY
PREFILLED_SYRINGE | INTRAVENOUS | Status: DC | PRN
  Administered 2017-05-15 (×2): 10 mg via INTRAVENOUS

## 2017-05-15 MED ORDER — CYCLOBENZAPRINE HCL 10 MG PO TABS
10.0000 mg | ORAL_TABLET | Freq: Three times a day (TID) | ORAL | Status: DC | PRN
  Administered 2017-05-16: 10 mg via ORAL
  Filled 2017-05-15: qty 1

## 2017-05-15 MED ORDER — HYDROMORPHONE HCL 1 MG/ML IJ SOLN
0.5000 mg | INTRAMUSCULAR | Status: DC | PRN
  Administered 2017-05-15: 0.5 mg via INTRAVENOUS

## 2017-05-15 MED ORDER — PROPOFOL 10 MG/ML IV BOLUS
INTRAVENOUS | Status: DC | PRN
  Administered 2017-05-15: 150 mg via INTRAVENOUS

## 2017-05-15 MED ORDER — DEXAMETHASONE SODIUM PHOSPHATE 10 MG/ML IJ SOLN: INTRAMUSCULAR | Status: DC | PRN

## 2017-05-15 MED ORDER — LIDOCAINE HCL (CARDIAC) 20 MG/ML IV SOLN
INTRAVENOUS | Status: DC | PRN
  Administered 2017-05-15: 40 mg via INTRAVENOUS

## 2017-05-15 MED ORDER — MIDAZOLAM HCL 2 MG/2ML IJ SOLN
INTRAMUSCULAR | Status: AC
  Filled 2017-05-15: qty 2

## 2017-05-15 MED ORDER — SODIUM CHLORIDE 0.9 % IR SOLN
Status: DC | PRN
  Administered 2017-05-15: 500 mL

## 2017-05-15 MED ORDER — VANCOMYCIN HCL IN DEXTROSE 750-5 MG/150ML-% IV SOLN
750.0000 mg | Freq: Two times a day (BID) | INTRAVENOUS | Status: DC
  Administered 2017-05-16 – 2017-05-17 (×3): 750 mg via INTRAVENOUS
  Filled 2017-05-15 (×4): qty 150

## 2017-05-15 MED ORDER — SUGAMMADEX SODIUM 200 MG/2ML IV SOLN
INTRAVENOUS | Status: AC
  Filled 2017-05-15: qty 4

## 2017-05-15 MED ORDER — SODIUM CHLORIDE 0.9% FLUSH
3.0000 mL | Freq: Two times a day (BID) | INTRAVENOUS | Status: DC
  Administered 2017-05-15 – 2017-05-16 (×3): 3 mL via INTRAVENOUS

## 2017-05-15 MED ORDER — ACETAMINOPHEN 650 MG RE SUPP: 650.0000 mg | RECTAL | Status: DC | PRN

## 2017-05-15 MED ORDER — PROPOFOL 10 MG/ML IV BOLUS
INTRAVENOUS | Status: AC
  Filled 2017-05-15: qty 20

## 2017-05-15 MED ORDER — ALUM & MAG HYDROXIDE-SIMETH 200-200-20 MG/5ML PO SUSP: 30.0000 mL | Freq: Four times a day (QID) | ORAL | Status: DC | PRN

## 2017-05-15 MED ORDER — ONDANSETRON HCL 4 MG/2ML IJ SOLN
INTRAMUSCULAR | Status: AC
  Filled 2017-05-15: qty 4

## 2017-05-15 MED ORDER — MENTHOL 3 MG MT LOZG: 1.0000 | LOZENGE | OROMUCOSAL | Status: DC | PRN

## 2017-05-15 MED ORDER — GLYCOPYRROLATE 0.2 MG/ML IJ SOLN
INTRAMUSCULAR | Status: DC | PRN
  Administered 2017-05-15: 0.2 mg via INTRAVENOUS

## 2017-05-15 MED ORDER — LIDOCAINE-EPINEPHRINE 1 %-1:100000 IJ SOLN
INTRAMUSCULAR | Status: DC | PRN
  Administered 2017-05-15: 7 mL

## 2017-05-15 MED ORDER — ONDANSETRON HCL 4 MG/2ML IJ SOLN: 4.0000 mg | Freq: Four times a day (QID) | INTRAMUSCULAR | Status: DC | PRN

## 2017-05-15 MED ORDER — FENTANYL CITRATE (PF) 100 MCG/2ML IJ SOLN
INTRAMUSCULAR | Status: DC | PRN
  Administered 2017-05-15: 50 ug via INTRAVENOUS
  Administered 2017-05-15: 100 ug via INTRAVENOUS
  Administered 2017-05-15: 25 ug via INTRAVENOUS
  Administered 2017-05-15: 50 ug via INTRAVENOUS
  Administered 2017-05-15: 25 ug via INTRAVENOUS

## 2017-05-15 MED ORDER — DOUBLE ANTIBIOTIC 500-10000 UNIT/GM EX OINT
TOPICAL_OINTMENT | CUTANEOUS | Status: AC
  Filled 2017-05-15: qty 1

## 2017-05-15 MED ORDER — ORAL CARE MOUTH RINSE
15.0000 mL | Freq: Two times a day (BID) | OROMUCOSAL | Status: DC
  Administered 2017-05-15 – 2017-05-17 (×4): 15 mL via OROMUCOSAL

## 2017-05-15 MED ORDER — ONDANSETRON HCL 4 MG/2ML IJ SOLN
INTRAMUSCULAR | Status: DC | PRN
  Administered 2017-05-15: 4 mg via INTRAVENOUS

## 2017-05-15 MED ORDER — SUGAMMADEX SODIUM 500 MG/5ML IV SOLN
INTRAVENOUS | Status: DC | PRN
  Administered 2017-05-15: 197 mg via INTRAVENOUS

## 2017-05-15 MED ORDER — ROCURONIUM BROMIDE 10 MG/ML (PF) SYRINGE
PREFILLED_SYRINGE | INTRAVENOUS | Status: AC
  Filled 2017-05-15: qty 10

## 2017-05-15 MED ORDER — PANTOPRAZOLE SODIUM 40 MG IV SOLR
40.0000 mg | Freq: Every day | INTRAVENOUS | Status: DC
  Administered 2017-05-15: 40 mg via INTRAVENOUS
  Filled 2017-05-15: qty 40

## 2017-05-15 MED ORDER — THROMBIN (RECOMBINANT) 20000 UNITS EX SOLR
CUTANEOUS | Status: AC
  Filled 2017-05-15: qty 20000

## 2017-05-15 MED ORDER — ONDANSETRON HCL 4 MG/2ML IJ SOLN
INTRAMUSCULAR | Status: AC
  Filled 2017-05-15: qty 2

## 2017-05-15 SURGICAL SUPPLY — 76 items
ALLOFUSE SELECT 5CC (Bone Implant) ×2 IMPLANT
ALLOFUSE SELECT CM 5CC (Bone Implant) ×1 IMPLANT
BAG DECANTER FOR FLEXI CONT (MISCELLANEOUS) ×3 IMPLANT
BENZOIN TINCTURE PRP APPL 2/3 (GAUZE/BANDAGES/DRESSINGS) ×3 IMPLANT
BIT DRILL SCRW 3.5 (BIT) ×3 IMPLANT
BLADE CLIPPER SURG (BLADE) ×3 IMPLANT
BLADE SURG 11 STRL SS (BLADE) ×3 IMPLANT
BUR MATCHSTICK NEURO 3.0 LAGG (BURR) ×3 IMPLANT
CANISTER SUCT 3000ML PPV (MISCELLANEOUS) ×3 IMPLANT
CAP LOCKING (Cap) ×2 IMPLANT
CARTRIDGE OIL MAESTRO DRILL (MISCELLANEOUS) ×1 IMPLANT
CLOSURE WOUND 1/2 X4 (GAUZE/BANDAGES/DRESSINGS) ×1
DECANTER SPIKE VIAL GLASS SM (MISCELLANEOUS) ×3 IMPLANT
DERMABOND ADVANCED (GAUZE/BANDAGES/DRESSINGS) ×2
DERMABOND ADVANCED .7 DNX12 (GAUZE/BANDAGES/DRESSINGS) ×1 IMPLANT
DIFFUSER DRILL AIR PNEUMATIC (MISCELLANEOUS) ×3 IMPLANT
DRAPE C-ARM 42X72 X-RAY (DRAPES) IMPLANT
DRAPE LAPAROTOMY 100X72 PEDS (DRAPES) ×3 IMPLANT
DRAPE MICROSCOPE LEICA (MISCELLANEOUS) IMPLANT
DRAPE POUCH INSTRU U-SHP 10X18 (DRAPES) ×3 IMPLANT
DRAPE SURG 17X23 STRL (DRAPES) ×12 IMPLANT
DRSG OPSITE POSTOP 4X6 (GAUZE/BANDAGES/DRESSINGS) ×3 IMPLANT
DURAPREP 26ML APPLICATOR (WOUND CARE) ×3 IMPLANT
ELECT BLADE 4.0 EZ CLEAN MEGAD (MISCELLANEOUS) ×3
ELECT REM PT RETURN 9FT ADLT (ELECTROSURGICAL) ×3
ELECTRODE BLDE 4.0 EZ CLN MEGD (MISCELLANEOUS) ×1 IMPLANT
ELECTRODE REM PT RTRN 9FT ADLT (ELECTROSURGICAL) ×1 IMPLANT
EVACUATOR 1/8 PVC DRAIN (DRAIN) ×3 IMPLANT
GAUZE SPONGE 4X4 12PLY STRL (GAUZE/BANDAGES/DRESSINGS) IMPLANT
GAUZE SPONGE 4X4 16PLY XRAY LF (GAUZE/BANDAGES/DRESSINGS) IMPLANT
GLOVE BIO SURGEON STRL SZ7 (GLOVE) IMPLANT
GLOVE BIO SURGEON STRL SZ8 (GLOVE) ×3 IMPLANT
GLOVE BIOGEL PI IND STRL 7.0 (GLOVE) ×1 IMPLANT
GLOVE BIOGEL PI IND STRL 7.5 (GLOVE) ×2 IMPLANT
GLOVE BIOGEL PI INDICATOR 7.0 (GLOVE) ×2
GLOVE BIOGEL PI INDICATOR 7.5 (GLOVE) ×4
GLOVE EXAM NITRILE LRG STRL (GLOVE) IMPLANT
GLOVE EXAM NITRILE XL STR (GLOVE) IMPLANT
GLOVE EXAM NITRILE XS STR PU (GLOVE) IMPLANT
GLOVE INDICATOR 8.5 STRL (GLOVE) ×3 IMPLANT
GLOVE SURG SS PI 7.0 STRL IVOR (GLOVE) ×3 IMPLANT
GLOVE SURG SS PI 7.5 STRL IVOR (GLOVE) ×3 IMPLANT
GOWN STRL REUS W/ TWL LRG LVL3 (GOWN DISPOSABLE) ×2 IMPLANT
GOWN STRL REUS W/ TWL XL LVL3 (GOWN DISPOSABLE) ×1 IMPLANT
GOWN STRL REUS W/TWL 2XL LVL3 (GOWN DISPOSABLE) IMPLANT
GOWN STRL REUS W/TWL LRG LVL3 (GOWN DISPOSABLE) ×4
GOWN STRL REUS W/TWL XL LVL3 (GOWN DISPOSABLE) ×2
IMPL QUARTEX 3.5X14MM (Neuro Prosthesis/Implant) ×1 IMPLANT
IMPLANT QUARTEX 3.5X14MM (Neuro Prosthesis/Implant) ×3 IMPLANT
KIT BASIN OR (CUSTOM PROCEDURE TRAY) ×3 IMPLANT
KIT INFUSE XX SMALL 0.7CC (Orthopedic Implant) ×3 IMPLANT
KIT ROOM TURNOVER OR (KITS) ×3 IMPLANT
LOCKING CAP (Cap) ×6 IMPLANT
MARKER SKIN DUAL TIP RULER LAB (MISCELLANEOUS) ×3 IMPLANT
NEEDLE HYPO 25X1 1.5 SAFETY (NEEDLE) ×3 IMPLANT
NEEDLE SPNL 20GX3.5 QUINCKE YW (NEEDLE) ×3 IMPLANT
NS IRRIG 1000ML POUR BTL (IV SOLUTION) ×3 IMPLANT
OIL CARTRIDGE MAESTRO DRILL (MISCELLANEOUS) ×3
PACK LAMINECTOMY NEURO (CUSTOM PROCEDURE TRAY) ×3 IMPLANT
PAD ARMBOARD 7.5X6 YLW CONV (MISCELLANEOUS) ×15 IMPLANT
PIN MAYFIELD SKULL DISP (PIN) ×3 IMPLANT
ROD QUARTEX ROD 40MM (Rod) ×3 IMPLANT
RUBBERBAND STERILE (MISCELLANEOUS) IMPLANT
SPONGE LAP 4X18 X RAY DECT (DISPOSABLE) IMPLANT
SPONGE SURGIFOAM ABS GEL 100 (HEMOSTASIS) ×3 IMPLANT
STRIP CLOSURE SKIN 1/2X4 (GAUZE/BANDAGES/DRESSINGS) ×2 IMPLANT
SUT ETHILON 4 0 PS 2 18 (SUTURE) IMPLANT
SUT VIC AB 0 CT1 18XCR BRD8 (SUTURE) ×1 IMPLANT
SUT VIC AB 0 CT1 8-18 (SUTURE) ×2
SUT VIC AB 2-0 CT1 18 (SUTURE) ×3 IMPLANT
SUT VIC AB 4-0 PS2 27 (SUTURE) ×3 IMPLANT
TISSUE ALLOFUSE SELECT 5CC (Bone Implant) ×1 IMPLANT
TOWEL GREEN STERILE (TOWEL DISPOSABLE) ×3 IMPLANT
TOWEL GREEN STERILE FF (TOWEL DISPOSABLE) ×3 IMPLANT
TRAY FOLEY W/METER SILVER 16FR (SET/KITS/TRAYS/PACK) ×3 IMPLANT
WATER STERILE IRR 1000ML POUR (IV SOLUTION) ×3 IMPLANT

## 2017-05-15 NOTE — Transfer of Care (Signed)
Immediate Anesthesia Transfer of Care Note  Patient: Feliberto GottronWanda C Insalaco  Procedure(s) Performed: Posterior Cervical Fusion with lateral mass fixation - Cervical seven-Thoracic one revision (N/A Back)  Patient Location: PACU  Anesthesia Type:General  Level of Consciousness: awake, alert , oriented and patient cooperative  Airway & Oxygen Therapy: Patient Spontanous Breathing and Patient connected to nasal cannula oxygen  Post-op Assessment: Report given to RN and Post -op Vital signs reviewed and stable  Post vital signs: Reviewed  Last Vitals: 167/100, 95, 20, 97% 3LNC Vitals:   05/15/17 1125  BP: (!) 172/96  Pulse: 62  Resp: 18  Temp: 36.8 C  SpO2: 97%    Last Pain:  Vitals:   05/15/17 1125  TempSrc: Oral         Complications: No apparent anesthesia complications

## 2017-05-15 NOTE — H&P (Signed)
Sierra Leach is an 66 y.o. female.   Chief Complaint: Neck pain HPI: 66 year old female with long-standing neck pain previously undergone a left-sided C7-T1 foraminotomy discectomy initially did fairly well however 7 progress worsening neck pain and workup including CT scan revealed a fractured and displaced left C7 screw. So due to patient's progression of clinical syndrome imaging findings and failure conservative treatment I recommended reexploration of her posterior cervical fusion removal of hardware and replacement of left C7 screw with pedicle screw at C7 or lateral mass screws C6 to pending on the anatomy. I've extensively gone over the risks and benefits of this procedure with the patient as well as perioperative course expectations of outcome and alternatives of surgery and she understands and agrees to proceed forward.  Past Medical History:  Diagnosis Date  . Anxiety   . Arthritis   . Depression   . GERD (gastroesophageal reflux disease)    occ  . Headache   . History of kidney stones   . Hypertension   . Lumbar back pain   . Peripheral vascular disease ([place]) 13   pulmonary embolus  . PONV (postoperative nausea and vomiting)   . Stroke ([place]) 99   lefe leg weakness    Past Surgical History:  Procedure Laterality Date  . ABDOMINAL HYSTERECTOMY    . BACK SURGERY     x8  . bone spurs     multiple  . BREAST SURGERY Left    lumpectomy x3  . COLONOSCOPY    . SHOULDER ARTHROSCOPY Left 03   rotatoer cuff  . SHOULDER ARTHROSCOPY Right 99   bone spur    Family History  Problem Relation Age of Onset  . High arches Mother   . Neuropathy Mother   . COPD Father   . High blood pressure Sister   . Diabetes Brother   . Parkinson's disease Brother    Social History:  reports that  has never smoked. she has never used smokeless tobacco. She reports that she does not drink alcohol or use drugs.  Allergies:  Allergies  Allergen Reactions  . Penicillins Hives, Itching and  Rash    Has patient had a PCN reaction causing immediate rash, facial/tongue/throat swelling, SOB or lightheadedness with hypotension: Yes Has patient had a PCN reaction causing severe rash involving mucus membranes or skin necrosis: No Has patient had a PCN reaction that required hospitalization: No Has patient had a PCN reaction occurring within the last 10 years: No If all of the above answers are "NO", then may proceed with Cephalosporin use.   . Sulfa Antibiotics Other (See Comments)    Breaks mouth out in blisters  . Adhesive [Tape] Rash    Medications Prior to Admission  Medication Sig Dispense Refill  . allopurinol (ZYLOPRIM) 100 MG tablet Take 100 mg by mouth at bedtime.     . carvedilol (COREG) 12.5 MG tablet Take 12.5 mg by mouth 2 (two) times daily with a meal.    . citalopram (CELEXA) 10 MG tablet Take 20 mg by mouth daily.     . diphenhydrAMINE (BENADRYL) 25 mg capsule Take 25 mg by mouth at bedtime.     Marland Kitchen HYDROcodone-acetaminophen (NORCO/VICODIN) 5-325 MG tablet Take 1 tablet by mouth every 6 (six) hours as needed for moderate pain. 30 tablet 0  . sennosides-docusate sodium (SENOKOT-S) 8.6-50 MG tablet Take 2 tablets by mouth at bedtime.    Marland Kitchen tiZANidine (ZANAFLEX) 2 MG tablet Take 2 mg by mouth every 4 (four)  hours as needed (back pain).   3  . warfarin (COUMADIN) 3 MG tablet Take 3-6 mg by mouth daily. Take 3 mg by mouth daily at night except on Mondays take 6 mg at night    . acetaminophen (TYLENOL) 500 MG tablet Take 1,500 mg by mouth daily as needed for mild pain.    . diazepam (VALIUM) 5 MG tablet Take 5 mg by mouth daily as needed for anxiety.     . meclizine (ANTIVERT) 25 MG tablet Take 25 mg by mouth every 6 (six) hours as needed for dizziness.      Results for orders placed or performed during the hospital encounter of 05/13/17 (from the past 48 hour(s))  Surgical pcr screen     Status: None   Collection Time: 05/13/17  1:28 PM  Result Value Ref Range   MRSA,  PCR NEGATIVE NEGATIVE   Staphylococcus aureus NEGATIVE NEGATIVE    Comment: (NOTE) The Xpert SA Assay (FDA approved for NASAL specimens in patients 72 years of age and older), is one component of a comprehensive surveillance program. It is not intended to diagnose infection nor to guide or monitor treatment. Performed at [facility], [place] [address] [place], [place] [address]   APTT     Status: None   Collection Time: 05/13/17  1:30 PM  Result Value Ref Range   aPTT 26 24 - 36 seconds  Basic metabolic panel     Status: Abnormal   Collection Time: 05/13/17  1:30 PM  Result Value Ref Range   Sodium 138 135 - 145 mmol/L   Potassium 3.9 3.5 - 5.1 mmol/L   Chloride 106 101 - 111 mmol/L   CO2 23 22 - 32 mmol/L   Glucose, Bld 111 (H) 65 - 99 mg/dL   BUN 13 6 - 20 mg/dL   Creatinine, Ser 1.27 (H) 0.44 - 1.00 mg/dL   Calcium 8.9 8.9 - 10.3 mg/dL   GFR calc non Af Amer 44 (L) >60 mL/min   GFR calc Af Amer 51 (L) >60 mL/min    Comment: (NOTE) The eGFR has been calculated using the CKD EPI equation. This calculation has not been validated in all clinical situations. eGFR's persistently <60 mL/min signify possible Chronic Kidney Disease.    Anion gap 9 5 - 15  CBC     Status: Abnormal   Collection Time: 05/13/17  1:30 PM  Result Value Ref Range   WBC 4.8 4.0 - 10.5 K/uL   RBC 4.61 3.87 - 5.11 MIL/uL   Hemoglobin 12.0 12.0 - 15.0 g/dL   HCT 38.7 36.0 - 46.0 %   MCV 83.9 78.0 - 100.0 fL   MCH 26.0 26.0 - 34.0 pg   MCHC 31.0 30.0 - 36.0 g/dL   RDW 16.4 (H) 11.5 - 15.5 %   Platelets 243 150 - 400 K/uL  PT- INR at PAT visit (Pre-admission Testing)     Status: None   Collection Time: 05/13/17  1:30 PM  Result Value Ref Range   Prothrombin Time 13.2 11.4 - 15.2 seconds   INR 1.01   Type and screen All Cardiac and thoracic surgeries, spinal fusions, myomectomies, craniotomies, colon & liver resections, total joint revisions, same day c-section with placenta previa or  accreta.     Status: None   Collection Time: 05/13/17  1:44 PM  Result Value Ref Range   ABO/RH(D) B POS    Antibody Screen NEG    Sample Expiration 05/27/2017  Extend sample reason NO TRANSFUSIONS OR PREGNANCY IN THE PAST 3 MONTHS    No results found.  Review of Systems  Musculoskeletal: Positive for joint pain and neck pain.  Neurological: Positive for tingling.    Blood pressure (!) 172/96, pulse 62, temperature 98.3 F (36.8 C), temperature source Oral, resp. rate 18, SpO2 97 %. Physical Exam  Constitutional: She is oriented to person, place, and time. She appears well-developed.  HENT:  Head: Normocephalic.  Eyes: Pupils are equal, round, and reactive to light.  Neck: Normal range of motion.  Neurological: She is alert and oriented to person, place, and time. She has normal strength. GCS eye subscore is 4. GCS verbal subscore is 5. GCS motor subscore is 6.  Patient is doing alert decreased range of motion of her neck secondary to pain strength is 5 out of 5 upper and lower extremities  Skin: Skin is warm and dry.     Assessment/Plan 66 year female presents for posterior cervical reexploration of fusion removal of hardware replacement of left C7 screw.  Cici Rodriges P, MD 05/15/2017, 12:57 PM

## 2017-05-15 NOTE — Anesthesia Preprocedure Evaluation (Addendum)
Anesthesia Evaluation  Patient identified by MRN, date of birth, ID band Patient awake    History of Anesthesia Complications (+) PONV and history of anesthetic complications  Airway Mallampati: II  TM Distance: >3 FB Neck ROM: Full    Dental  (+) Dental Advisory Given, Teeth Intact   Pulmonary neg pulmonary ROS,    breath sounds clear to auscultation       Cardiovascular hypertension,  Rhythm:Regular Rate:Normal     Neuro/Psych  Headaches, CVA, Residual Symptoms    GI/Hepatic GERD  Medicated and Controlled,  Endo/Other    Renal/GU      Musculoskeletal   Abdominal   Peds  Hematology   Anesthesia Other Findings   Reproductive/Obstetrics                            Anesthesia Physical Anesthesia Plan  ASA: III  Anesthesia Plan: General   Post-op Pain Management:    Induction: Intravenous  PONV Risk Score and Plan: 4 or greater  Airway Management Planned: Oral ETT  Additional Equipment:   Intra-op Plan:   Post-operative Plan: Extubation in OR  Informed Consent: I have reviewed the patients History and Physical, chart, labs and discussed the procedure including the risks, benefits and alternatives for the proposed anesthesia with the patient or authorized representative who has indicated his/her understanding and acceptance.   Dental advisory given  Plan Discussed with: CRNA and Anesthesiologist  Anesthesia Plan Comments:        Anesthesia Quick Evaluation

## 2017-05-15 NOTE — Anesthesia Procedure Notes (Signed)
Procedure Name: Intubation Date/Time: 05/15/2017 2:06 PM Performed by: Glynda Jaeger, CRNA Pre-anesthesia Checklist: Patient identified, Patient being monitored, Timeout performed, Emergency Drugs available and Suction available Patient Re-evaluated:Patient Re-evaluated prior to induction Oxygen Delivery Method: Circle System Utilized Preoxygenation: Pre-oxygenation with 100% oxygen Induction Type: IV induction Ventilation: Mask ventilation without difficulty Laryngoscope Size: Mac and 3 Grade View: Grade I Tube type: Oral Tube size: 7.5 mm Number of attempts: 1 Airway Equipment and Method: Stylet Placement Confirmation: ETT inserted through vocal cords under direct vision,  positive ETCO2 and breath sounds checked- equal and bilateral Secured at: 22 cm Tube secured with: Tape Dental Injury: Teeth and Oropharynx as per pre-operative assessment

## 2017-05-15 NOTE — Op Note (Signed)
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  EBL: Minimal  History of present illness: Patient is very pleasant 66 year old female is a progress worsening neck and left shoulder and arm pain she previously about 3 or 4 months ago underwent a posterior cervical fusion at C7-T1. Removal of ruptured disc. She initially was doing fairly well but then had acute worsening neck and shoulder pain workup revealed fractured lateral mass of C7 and displacement of left C7 screw. Due to patient's progression of clinical syndrome imaging findings failure conservative treatment I recommended revision of her left-sided C7-T1 fusion with removal of the C7 screw and placement of a C6 lateral mass screw. I extensively reviewed the risks and benefits of the operation with her as well as perioperative course expectations of outcome alternatives of surgery and she understood and agreed to proceed forward.  Operative procedure: Patient brought into the or was induced on general anesthesia positioned prone in pins in the neck in slight flexion Beck 7 neck is straight prepped and draped in routine sterile fashion. Her old incision was opened up after being infiltrated with 10 mL lidocaine with epi the scar tissues dissected free and dissected through the spinous process and lamina complex on the left identified the hardware at C7-T1. Dissected out the hardware removed in its remove the rods removed the loose C7 screw. Then with Bovie light cautery dissect down to the screw hole to identify the residual the lateral mass of C7 and the lateral mass of C6. I inspected to evaluate a similar to place a  C7 pedicle screw and the bony anatomy was distorted I elected to place a C6 lateral mass screw. So pilot hole was drilled in for medial aspect of the lateral masses C6 12 mm hole was drilled 40 mm screw was placed and screws excellent purchase and then I fashioned a rod aggressively decorticated the transverse process and rib head of T1 the residua of the lateral mass of C7 and lateral mass C6 laid a small piece of BMP as well as the morsels from C6-T1 assembled the rod and top tightening nuts were anchored in place a medium drain was placed was Cobos irrigated and vancomycin powder was speckled in the wound and the wounds closed in layers with interrupted Vicryl the skin was closed running 4 subcuticular Dermabond benzo and Steri-Strips and sterile dressings applied and patient recovered in stable condition. At the end the case all needle counts sponge counts were correct.

## 2017-05-15 NOTE — Anesthesia Postprocedure Evaluation (Signed)
Anesthesia Post Note  Patient: Sierra Leach  Procedure(s) Performed: Posterior Cervical Fusion with lateral mass fixation - Cervical seven-Thoracic one revision (N/A Back)     Patient location during evaluation: PACU Anesthesia Type: General Level of consciousness: awake, awake and alert and oriented Pain management: pain level controlled Vital Signs Assessment: post-procedure vital signs reviewed and stable Respiratory status: spontaneous breathing, nonlabored ventilation and respiratory function stable Cardiovascular status: blood pressure returned to baseline Anesthetic complications: no    Last Vitals:  Vitals:   05/15/17 1846 05/15/17 2023  BP: (!) 157/96   Pulse: 79   Resp:    Temp:  36.9 C  SpO2: 99%     Last Pain:  Vitals:   05/15/17 2023  TempSrc: Oral  PainSc:                  Ariell Gunnels COKER

## 2017-05-16 ENCOUNTER — Other Ambulatory Visit: Payer: Self-pay

## 2017-05-16 MED ORDER — PANTOPRAZOLE SODIUM 40 MG PO TBEC
40.0000 mg | DELAYED_RELEASE_TABLET | Freq: Every day | ORAL | Status: DC
  Administered 2017-05-16: 40 mg via ORAL
  Filled 2017-05-16: qty 1

## 2017-05-16 NOTE — Care Management Note (Signed)
Case Management Note  Patient Details  Name: Sierra GottronWanda C Leach MRN: [id number] Date of Birth: [birth year]  Subjective/Objective:  Pt admitted on 05/15/17 s/p exploration of C7-T1 with removal of LT C7 lateral mass screw with C6 lat mass screww replacement and arthrodesis C6-T1.  PTA, pt independent, lives at home with mother.                    Action/Plan: Pt states her mother has Alzheimer's Disease, but sister plans to assist at dc.  Pt has all needed DME at home.   She denies any home needs at this time.    Expected Discharge Date:                  Expected Discharge Plan:  Home/Self Care  In-House Referral:     Discharge planning Services  CM Consult  Post Acute Care Choice:    Choice offered to:     DME Arranged:    DME Agency:     HH Arranged:    HH Agency:     Status of Service:  Completed, signed off  If discussed at [employer] Length of Stay Meetings, dates discussed:    Additional Comments:  Quintella BatonJulie W. Jalien Weakland, RN, BSN  Trauma/Neuro ICU Case Manager [phone]

## 2017-05-17 ENCOUNTER — Encounter (HOSPITAL_COMMUNITY): Payer: Self-pay | Admitting: Neurosurgery

## 2017-05-17 LAB — BASIC METABOLIC PANEL
Anion gap: 6 (ref 5–15)
BUN: 16 mg/dL (ref 6–20)
CHLORIDE: 102 mmol/L (ref 101–111)
CO2: 29 mmol/L (ref 22–32)
CREATININE: 1.14 mg/dL — AB (ref 0.44–1.00)
Calcium: 8.4 mg/dL — ABNORMAL LOW (ref 8.9–10.3)
GFR, EST AFRICAN AMERICAN: 58 mL/min — AB (ref >60)
GFR, EST NON AFRICAN AMERICAN: 50 mL/min — AB (ref >60)
Glucose, Bld: 95 mg/dL (ref 65–99)
POTASSIUM: 3.6 mmol/L (ref 3.5–5.1)
SODIUM: 137 mmol/L (ref 135–145)

## 2017-05-17 MED ORDER — OXYCODONE HCL 15 MG PO TABS: 15.0000 mg | ORAL_TABLET | ORAL | 0 refills | Status: DC | PRN

## 2017-05-17 NOTE — Plan of Care (Signed)
  Progressing Bowel/Gastric: Gastrointestinal status for postoperative course will improve 05/17/2017 0101 - Progressing by Leata MouseAninon, Camara Renstrom S, RN Education: Ability to verbalize activity precautions or restrictions will improve 05/17/2017 0101 - Progressing by Leata MouseAninon, Neizan Debruhl S, RN Knowledge of the prescribed therapeutic regimen will improve 05/17/2017 0101 - Progressing by Leata MouseAninon, Jerl Munyan S, RN Understanding of discharge needs will improve 05/17/2017 0101 - Progressing by Leata MouseAninon, Dustin Bumbaugh S, RN Health Behavior/Discharge Planning: Identification of resources available to assist in meeting health care needs will improve 05/17/2017 0101 - Progressing by Leata MouseAninon, Yoshiko Keleher S, RN Bladder/Genitourinary: Urinary functional status for postoperative course will improve 05/17/2017 0101 - Progressing by Leata MouseAninon, Kayliee Atienza S, RN Education: Knowledge of General Education information will improve 05/17/2017 0101 - Progressing by Leata MouseAninon, Carlissa Pesola S, RN Health Behavior/Discharge Planning: Ability to manage health-related needs will improve 05/17/2017 0101 - Progressing by Leata MouseAninon, Daniell Mancinas S, RN Elimination: Will not experience complications related to bowel motility 05/17/2017 0101 - Progressing by Leata MouseAninon, Dartanyon Frankowski S, RN Will not experience complications related to urinary retention 05/17/2017 0101 - Progressing by Leata MouseAninon, Keshia Weare S, RN

## 2017-05-17 NOTE — Discharge Summary (Signed)
Physician Discharge Summary  Patient ID: Sierra GottronWanda C Fenley MRN: [id number] DOB/AGE: October 06, 1950 66 y.o.  Admit date: 05/15/2017 Discharge date: 05/17/2017  Admission Diagnoses: pseudoArthrosis failure of hardware  Discharge Diagnoses: Same Active Problems:   Pseudoarthrosis of cervical spine [facility](HCC)   Discharged Condition: good  Hospital Course: Patient was Hospital underwent exploration of fusion removal of hardware with revision of fusion with removal of left C7 screw placement of left C6 lateral mass screw and posterior lateral arthrodesis. Postoperatively patient did very well recovered in the floor on the floor was angling and voiding spontaneously tolerating regular diet and pain was controlled on oral analgesics. She was stable for discharge postoperative day 2.  Consults: Significant Diagnostic Studies: Treatments: Posterior lateral arthrodesis C6-T1 Discharge Exam: Blood pressure 110/71, pulse 69, temperature 98.4 F (36.9 C), temperature source Oral, resp. rate 20, height 5\' 3"  (1.6 m), weight 97.9 kg (215 lb 13.3 oz), SpO2 98 %. Strength out of 5 wound clean dry and intact  Disposition: Home   Allergies as of 05/17/2017      Reactions   Penicillins Hives, Itching, Rash   Has patient had a PCN reaction causing immediate rash, facial/tongue/throat swelling, SOB or lightheadedness with hypotension: Yes Has patient had a PCN reaction causing severe rash involving mucus membranes or skin necrosis: No Has patient had a PCN reaction that required hospitalization: No Has patient had a PCN reaction occurring within the last 10 years: No If all of the above answers are "NO", then may proceed with Cephalosporin use.   Sulfa Antibiotics Other (See Comments)   Breaks mouth out in blisters   Adhesive [tape] Rash      Medication List    TAKE these medications   acetaminophen 500 MG tablet Commonly known as:  TYLENOL Take 1,500 mg by mouth daily as needed for mild pain.    allopurinol 100 MG tablet Commonly known as:  ZYLOPRIM Take 100 mg by mouth at bedtime.   BENADRYL 25 mg capsule Generic drug:  diphenhydrAMINE Take 25 mg by mouth at bedtime.   carvedilol 12.5 MG tablet Commonly known as:  COREG Take 12.5 mg by mouth 2 (two) times daily with a meal.   citalopram 10 MG tablet Commonly known as:  CELEXA Take 20 mg by mouth daily.   diazepam 5 MG tablet Commonly known as:  VALIUM Take 5 mg by mouth daily as needed for anxiety.   HYDROcodone-acetaminophen 5-325 MG tablet Commonly known as:  NORCO/VICODIN Take 1 tablet by mouth every 6 (six) hours as needed for moderate pain.   meclizine 25 MG tablet Commonly known as:  ANTIVERT Take 25 mg by mouth every 6 (six) hours as needed for dizziness.   oxyCODONE 15 MG immediate release tablet Commonly known as:  ROXICODONE Take 1 tablet (15 mg total) every 3 (three) hours as needed by mouth for severe pain ((score 7 to 10)).   sennosides-docusate sodium 8.6-50 MG tablet Commonly known as:  SENOKOT-S Take 2 tablets by mouth at bedtime.   tiZANidine 2 MG tablet Commonly known as:  ZANAFLEX Take 2 mg by mouth every 4 (four) hours as needed (back pain).   warfarin 3 MG tablet Commonly known as:  COUMADIN Take 3-6 mg by mouth daily. Take 3 mg by mouth daily at night except on Mondays take 6 mg at night        Signed: Renie Stelmach P 05/17/2017, 7:13 AM

## 2017-05-21 ENCOUNTER — Encounter (HOSPITAL_COMMUNITY): Payer: Self-pay | Admitting: Neurosurgery

## 2017-06-12 ENCOUNTER — Other Ambulatory Visit: Payer: Self-pay | Admitting: Neurosurgery

## 2017-06-18 ENCOUNTER — Encounter (HOSPITAL_COMMUNITY): Payer: Self-pay | Admitting: *Deleted

## 2017-06-18 ENCOUNTER — Other Ambulatory Visit: Payer: Self-pay

## 2017-06-18 NOTE — Progress Notes (Signed)
Spoke with pt for pre-op call. Pt denies cardiac history, chest pain or sob. Hx of PE and takes Coumadin. Pt states last dose of Coumadin was 06/10/17. Pt states she is not diabetic.

## 2017-06-19 ENCOUNTER — Inpatient Hospital Stay (HOSPITAL_COMMUNITY): Payer: Medicare HMO | Admitting: Anesthesiology

## 2017-06-19 ENCOUNTER — Inpatient Hospital Stay (HOSPITAL_COMMUNITY)
Admission: RE | Admit: 2017-06-19 | Discharge: 2017-06-20 | DRG: 497 | Disposition: A | Discharge status: Home / Self Care | Payer: Medicare HMO | Source: Ambulatory Visit | Attending: Neurosurgery | Admitting: Neurosurgery

## 2017-06-19 ENCOUNTER — Encounter (HOSPITAL_COMMUNITY)
Admission: RE | Disposition: A | Discharge status: Home / Self Care | Payer: Self-pay | Source: Ambulatory Visit | Attending: Neurosurgery

## 2017-06-19 ENCOUNTER — Encounter (HOSPITAL_COMMUNITY): Payer: Self-pay | Admitting: *Deleted

## 2017-06-19 DIAGNOSIS — Z8673 Personal history of transient ischemic attack (TIA), and cerebral infarction without residual deficits: Secondary | ICD-10-CM | POA: Diagnosis not present

## 2017-06-19 DIAGNOSIS — Z820 Family history of epilepsy and other diseases of the nervous system: Secondary | ICD-10-CM | POA: Diagnosis not present

## 2017-06-19 DIAGNOSIS — Z87442 Personal history of urinary calculi: Secondary | ICD-10-CM | POA: Diagnosis not present

## 2017-06-19 DIAGNOSIS — Z825 Family history of asthma and other chronic lower respiratory diseases: Secondary | ICD-10-CM

## 2017-06-19 DIAGNOSIS — F418 Other specified anxiety disorders: Secondary | ICD-10-CM | POA: Diagnosis present

## 2017-06-19 DIAGNOSIS — Z90710 Acquired absence of both cervix and uterus: Secondary | ICD-10-CM | POA: Diagnosis not present

## 2017-06-19 DIAGNOSIS — Z833 Family history of diabetes mellitus: Secondary | ICD-10-CM

## 2017-06-19 DIAGNOSIS — Z888 Allergy status to other drugs, medicaments and biological substances status: Secondary | ICD-10-CM | POA: Diagnosis not present

## 2017-06-19 DIAGNOSIS — Z880 Allergy status to penicillin: Secondary | ICD-10-CM

## 2017-06-19 DIAGNOSIS — I10 Essential (primary) hypertension: Secondary | ICD-10-CM | POA: Diagnosis present

## 2017-06-19 DIAGNOSIS — K219 Gastro-esophageal reflux disease without esophagitis: Secondary | ICD-10-CM | POA: Diagnosis present

## 2017-06-19 DIAGNOSIS — Z882 Allergy status to sulfonamides status: Secondary | ICD-10-CM

## 2017-06-19 DIAGNOSIS — Y831 Surgical operation with implant of artificial internal device as the cause of abnormal reaction of the patient, or of later complication, without mention of misadventure at the time of the procedure: Secondary | ICD-10-CM | POA: Diagnosis present

## 2017-06-19 DIAGNOSIS — Z86711 Personal history of pulmonary embolism: Secondary | ICD-10-CM

## 2017-06-19 DIAGNOSIS — T84226A Displacement of internal fixation device of vertebrae, initial encounter: Secondary | ICD-10-CM | POA: Diagnosis present

## 2017-06-19 DIAGNOSIS — I739 Peripheral vascular disease, unspecified: Secondary | ICD-10-CM | POA: Diagnosis present

## 2017-06-19 DIAGNOSIS — T8484XA Pain due to internal orthopedic prosthetic devices, implants and grafts, initial encounter: Secondary | ICD-10-CM | POA: Diagnosis present

## 2017-06-19 DIAGNOSIS — Z8249 Family history of ischemic heart disease and other diseases of the circulatory system: Secondary | ICD-10-CM | POA: Diagnosis not present

## 2017-06-19 HISTORY — DX: Pneumonia, unspecified organism: J18.9

## 2017-06-19 HISTORY — PX: POSTERIOR CERVICAL FUSION/FORAMINOTOMY: SHX5038

## 2017-06-19 LAB — POCT I-STAT 4, (NA,K, GLUC, HGB,HCT)
Glucose, Bld: 91 mg/dL (ref 65–99)
HCT: 32 % — ABNORMAL LOW (ref 36.0–46.0)
HEMOGLOBIN: 10.9 g/dL — AB (ref 12.0–15.0)
Potassium: 3.5 mmol/L (ref 3.5–5.1)
Sodium: 141 mmol/L (ref 135–145)

## 2017-06-19 LAB — CBC
HEMATOCRIT: 38.5 % (ref 36.0–46.0)
HEMOGLOBIN: 11.8 g/dL — AB (ref 12.0–15.0)
MCH: 25.8 pg — AB (ref 26.0–34.0)
MCHC: 30.6 g/dL (ref 30.0–36.0)
MCV: 84.2 fL (ref 78.0–100.0)
Platelets: 203 10*3/uL (ref 150–400)
RBC: 4.57 MIL/uL (ref 3.87–5.11)
RDW: 16.4 % — AB (ref 11.5–15.5)
WBC: 5.3 10*3/uL (ref 4.0–10.5)

## 2017-06-19 LAB — BASIC METABOLIC PANEL
Anion gap: 12 (ref 5–15)
BUN: 16 mg/dL (ref 6–20)
CALCIUM: 8.8 mg/dL — AB (ref 8.9–10.3)
CHLORIDE: 104 mmol/L (ref 101–111)
CO2: 20 mmol/L — AB (ref 22–32)
CREATININE: 1.05 mg/dL — AB (ref 0.44–1.00)
GFR calc non Af Amer: 55 mL/min — ABNORMAL LOW (ref >60)
Glucose, Bld: 108 mg/dL — ABNORMAL HIGH (ref 65–99)
Potassium: 6 mmol/L — ABNORMAL HIGH (ref 3.5–5.1)
SODIUM: 136 mmol/L (ref 135–145)

## 2017-06-19 LAB — PROTIME-INR
INR: 0.96
Prothrombin Time: 12.7 seconds (ref 11.4–15.2)

## 2017-06-19 SURGERY — POSTERIOR CERVICAL FUSION/FORAMINOTOMY LEVEL 1
Anesthesia: General | Site: Neck

## 2017-06-19 MED ORDER — LACTATED RINGERS IV SOLN
INTRAVENOUS | Status: DC
  Administered 2017-06-19: 15:00:00 via INTRAVENOUS

## 2017-06-19 MED ORDER — LIDOCAINE-EPINEPHRINE 1 %-1:100000 IJ SOLN
INTRAMUSCULAR | Status: DC | PRN
  Administered 2017-06-19: 5 mL

## 2017-06-19 MED ORDER — ONDANSETRON HCL 4 MG PO TABS: 4.0000 mg | ORAL_TABLET | Freq: Four times a day (QID) | ORAL | Status: DC | PRN

## 2017-06-19 MED ORDER — DEXAMETHASONE SODIUM PHOSPHATE 4 MG/ML IJ SOLN
INTRAMUSCULAR | Status: DC | PRN
  Administered 2017-06-19: 10 mg via INTRAVENOUS

## 2017-06-19 MED ORDER — DIPHENHYDRAMINE HCL 25 MG PO CAPS
25.0000 mg | ORAL_CAPSULE | Freq: Every day | ORAL | Status: DC
  Administered 2017-06-19: 25 mg via ORAL
  Filled 2017-06-19: qty 1

## 2017-06-19 MED ORDER — BACITRACIN 50000 UNITS IM SOLR
INTRAMUSCULAR | Status: DC | PRN
  Administered 2017-06-19: 15:00:00

## 2017-06-19 MED ORDER — DIAZEPAM 5 MG PO TABS: 5.0000 mg | ORAL_TABLET | Freq: Every day | ORAL | Status: DC | PRN

## 2017-06-19 MED ORDER — SODIUM CHLORIDE 0.9 % IV SOLN
250.0000 mL | INTRAVENOUS | Status: DC
  Administered 2017-06-19: 250 mL via INTRAVENOUS

## 2017-06-19 MED ORDER — SENNOSIDES-DOCUSATE SODIUM 8.6-50 MG PO TABS
2.0000 | ORAL_TABLET | Freq: Every day | ORAL | Status: DC
  Administered 2017-06-19: 2 via ORAL
  Filled 2017-06-19: qty 2

## 2017-06-19 MED ORDER — BACITRACIN ZINC 500 UNIT/GM EX OINT
TOPICAL_OINTMENT | CUTANEOUS | Status: DC | PRN
  Administered 2017-06-19: 1 via TOPICAL

## 2017-06-19 MED ORDER — OXYCODONE HCL 5 MG/5ML PO SOLN: 5.0000 mg | Freq: Once | ORAL | Status: DC | PRN

## 2017-06-19 MED ORDER — TIZANIDINE HCL 4 MG PO TABS
2.0000 mg | ORAL_TABLET | ORAL | Status: DC | PRN
  Administered 2017-06-19 – 2017-06-20 (×3): 2 mg via ORAL
  Filled 2017-06-19 (×3): qty 1

## 2017-06-19 MED ORDER — WARFARIN SODIUM 3 MG PO TABS
3.0000 mg | ORAL_TABLET | ORAL | Status: DC
  Filled 2017-06-19: qty 1

## 2017-06-19 MED ORDER — ONDANSETRON HCL 4 MG/2ML IJ SOLN
INTRAMUSCULAR | Status: DC | PRN
  Administered 2017-06-19: 4 mg via INTRAVENOUS

## 2017-06-19 MED ORDER — SUGAMMADEX SODIUM 200 MG/2ML IV SOLN
INTRAVENOUS | Status: DC | PRN
  Administered 2017-06-19: 500 mg via INTRAVENOUS

## 2017-06-19 MED ORDER — FENTANYL CITRATE (PF) 100 MCG/2ML IJ SOLN
INTRAMUSCULAR | Status: DC | PRN
  Administered 2017-06-19: 150 ug via INTRAVENOUS

## 2017-06-19 MED ORDER — FENTANYL CITRATE (PF) 100 MCG/2ML IJ SOLN: 25.0000 ug | INTRAMUSCULAR | Status: DC | PRN

## 2017-06-19 MED ORDER — LIDOCAINE-EPINEPHRINE 1 %-1:100000 IJ SOLN
INTRAMUSCULAR | Status: AC
  Filled 2017-06-19: qty 1

## 2017-06-19 MED ORDER — SODIUM CHLORIDE 0.9% FLUSH: 3.0000 mL | INTRAVENOUS | Status: DC | PRN

## 2017-06-19 MED ORDER — ACETAMINOPHEN 500 MG PO TABS: 1000.0000 mg | ORAL_TABLET | Freq: Four times a day (QID) | ORAL | Status: DC | PRN

## 2017-06-19 MED ORDER — SODIUM CHLORIDE 0.9% FLUSH: 3.0000 mL | Freq: Two times a day (BID) | INTRAVENOUS | Status: DC

## 2017-06-19 MED ORDER — VANCOMYCIN HCL IN DEXTROSE 1-5 GM/200ML-% IV SOLN
INTRAVENOUS | Status: AC
  Filled 2017-06-19: qty 200

## 2017-06-19 MED ORDER — PROPOFOL 10 MG/ML IV BOLUS
INTRAVENOUS | Status: AC
  Filled 2017-06-19: qty 20

## 2017-06-19 MED ORDER — ONDANSETRON HCL 4 MG/2ML IJ SOLN: 4.0000 mg | Freq: Once | INTRAMUSCULAR | Status: DC | PRN

## 2017-06-19 MED ORDER — MIDAZOLAM HCL 5 MG/5ML IJ SOLN
INTRAMUSCULAR | Status: DC | PRN
  Administered 2017-06-19: 2 mg via INTRAVENOUS

## 2017-06-19 MED ORDER — WARFARIN - PHYSICIAN DOSING INPATIENT: Freq: Every day | Status: DC

## 2017-06-19 MED ORDER — WARFARIN SODIUM 6 MG PO TABS: 6.0000 mg | ORAL_TABLET | ORAL | Status: DC

## 2017-06-19 MED ORDER — MECLIZINE HCL 12.5 MG PO TABS
25.0000 mg | ORAL_TABLET | Freq: Four times a day (QID) | ORAL | Status: DC | PRN
Start: 2017-06-19 — End: 2017-06-20

## 2017-06-19 MED ORDER — HEMOSTATIC AGENTS (NO CHARGE) OPTIME
TOPICAL | Status: DC | PRN
  Administered 2017-06-19: 1 via TOPICAL

## 2017-06-19 MED ORDER — HYDROMORPHONE HCL 1 MG/ML IJ SOLN: 0.5000 mg | INTRAMUSCULAR | Status: DC | PRN

## 2017-06-19 MED ORDER — VANCOMYCIN HCL 10 G IV SOLR
1500.0000 mg | Freq: Once | INTRAVENOUS | Status: AC
  Administered 2017-06-20: 1500 mg via INTRAVENOUS
  Filled 2017-06-19: qty 1500

## 2017-06-19 MED ORDER — EPHEDRINE SULFATE 50 MG/ML IJ SOLN
INTRAMUSCULAR | Status: DC | PRN
  Administered 2017-06-19: 10 mg via INTRAVENOUS

## 2017-06-19 MED ORDER — PHENYLEPHRINE HCL 10 MG/ML IJ SOLN
INTRAMUSCULAR | Status: DC | PRN
  Administered 2017-06-19 (×4): 80 ug via INTRAVENOUS
  Administered 2017-06-19: 120 ug via INTRAVENOUS

## 2017-06-19 MED ORDER — CITALOPRAM HYDROBROMIDE 20 MG PO TABS
20.0000 mg | ORAL_TABLET | Freq: Every day | ORAL | Status: DC
Start: 2017-06-20 — End: 2017-06-20
  Administered 2017-06-20: 20 mg via ORAL
  Filled 2017-06-19: qty 1

## 2017-06-19 MED ORDER — THROMBIN (RECOMBINANT) 5000 UNITS EX SOLR
CUTANEOUS | Status: AC
Start: 2017-06-19 — End: 2017-06-19
  Filled 2017-06-19: qty 5000

## 2017-06-19 MED ORDER — OXYCODONE HCL 5 MG PO TABS
5.0000 mg | ORAL_TABLET | ORAL | Status: DC | PRN
  Administered 2017-06-19 – 2017-06-20 (×3): 5 mg via ORAL
  Filled 2017-06-19 (×3): qty 1

## 2017-06-19 MED ORDER — ONDANSETRON HCL 4 MG/2ML IJ SOLN: 4.0000 mg | Freq: Four times a day (QID) | INTRAMUSCULAR | Status: DC | PRN

## 2017-06-19 MED ORDER — CARVEDILOL 6.25 MG PO TABS
12.5000 mg | ORAL_TABLET | Freq: Two times a day (BID) | ORAL | Status: DC
  Administered 2017-06-19 – 2017-06-20 (×2): 12.5 mg via ORAL
  Filled 2017-06-19 (×2): qty 2

## 2017-06-19 MED ORDER — THROMBIN (RECOMBINANT) 20000 UNITS EX SOLR
CUTANEOUS | Status: AC
  Filled 2017-06-19: qty 20000

## 2017-06-19 MED ORDER — ROCURONIUM BROMIDE 100 MG/10ML IV SOLN
INTRAVENOUS | Status: DC | PRN
  Administered 2017-06-19: 50 mg via INTRAVENOUS

## 2017-06-19 MED ORDER — PANTOPRAZOLE SODIUM 40 MG IV SOLR: 40.0000 mg | Freq: Every day | INTRAVENOUS | Status: DC

## 2017-06-19 MED ORDER — PANTOPRAZOLE SODIUM 40 MG PO TBEC
40.0000 mg | DELAYED_RELEASE_TABLET | Freq: Every day | ORAL | Status: DC
  Administered 2017-06-19: 40 mg via ORAL
  Filled 2017-06-19: qty 1

## 2017-06-19 MED ORDER — PROPOFOL 10 MG/ML IV BOLUS
INTRAVENOUS | Status: DC | PRN
  Administered 2017-06-19: 150 mg via INTRAVENOUS

## 2017-06-19 MED ORDER — BUPIVACAINE HCL (PF) 0.25 % IJ SOLN
INTRAMUSCULAR | Status: AC
  Filled 2017-06-19: qty 30

## 2017-06-19 MED ORDER — CYCLOBENZAPRINE HCL 10 MG PO TABS: 10.0000 mg | ORAL_TABLET | Freq: Three times a day (TID) | ORAL | Status: DC | PRN

## 2017-06-19 MED ORDER — THROMBIN (RECOMBINANT) 5000 UNITS EX SOLR
CUTANEOUS | Status: DC | PRN
  Administered 2017-06-19: 5000 [IU] via TOPICAL

## 2017-06-19 MED ORDER — PHENOL 1.4 % MT LIQD: 1.0000 | OROMUCOSAL | Status: DC | PRN

## 2017-06-19 MED ORDER — ACETAMINOPHEN 325 MG PO TABS: 650.0000 mg | ORAL_TABLET | ORAL | Status: DC | PRN

## 2017-06-19 MED ORDER — LIDOCAINE HCL (CARDIAC) 20 MG/ML IV SOLN
INTRAVENOUS | Status: DC | PRN
  Administered 2017-06-19: 100 mg via INTRAVENOUS

## 2017-06-19 MED ORDER — ALLOPURINOL 100 MG PO TABS
200.0000 mg | ORAL_TABLET | Freq: Every day | ORAL | Status: DC
  Administered 2017-06-19: 200 mg via ORAL
  Filled 2017-06-19: qty 2

## 2017-06-19 MED ORDER — BACITRACIN ZINC 500 UNIT/GM EX OINT
TOPICAL_OINTMENT | CUTANEOUS | Status: AC
  Filled 2017-06-19: qty 28.35

## 2017-06-19 MED ORDER — ACETAMINOPHEN 650 MG RE SUPP: 650.0000 mg | RECTAL | Status: DC | PRN

## 2017-06-19 MED ORDER — OXYCODONE HCL 5 MG PO TABS: 5.0000 mg | ORAL_TABLET | Freq: Once | ORAL | Status: DC | PRN

## 2017-06-19 MED ORDER — 0.9 % SODIUM CHLORIDE (POUR BTL) OPTIME
TOPICAL | Status: DC | PRN
  Administered 2017-06-19: 1000 mL

## 2017-06-19 MED ORDER — MIDAZOLAM HCL 2 MG/2ML IJ SOLN
INTRAMUSCULAR | Status: AC
  Filled 2017-06-19: qty 2

## 2017-06-19 MED ORDER — FENTANYL CITRATE (PF) 250 MCG/5ML IJ SOLN
INTRAMUSCULAR | Status: AC
  Filled 2017-06-19: qty 5

## 2017-06-19 MED ORDER — ALUM & MAG HYDROXIDE-SIMETH 200-200-20 MG/5ML PO SUSP: 30.0000 mL | Freq: Four times a day (QID) | ORAL | Status: DC | PRN

## 2017-06-19 MED ORDER — MENTHOL 3 MG MT LOZG: 1.0000 | LOZENGE | OROMUCOSAL | Status: DC | PRN

## 2017-06-19 SURGICAL SUPPLY — 59 items
BAG DECANTER FOR FLEXI CONT (MISCELLANEOUS) ×3 IMPLANT
BENZOIN TINCTURE PRP APPL 2/3 (GAUZE/BANDAGES/DRESSINGS) ×3 IMPLANT
BLADE CLIPPER SURG (BLADE) ×3 IMPLANT
BLADE SURG 11 STRL SS (BLADE) ×3 IMPLANT
BUR MATCHSTICK NEURO 3.0 LAGG (BURR) ×3 IMPLANT
CANISTER SUCT 3000ML PPV (MISCELLANEOUS) ×3 IMPLANT
CARTRIDGE OIL MAESTRO DRILL (MISCELLANEOUS) ×1 IMPLANT
CLOSURE WOUND 1/2 X4 (GAUZE/BANDAGES/DRESSINGS) ×1
DECANTER SPIKE VIAL GLASS SM (MISCELLANEOUS) ×3 IMPLANT
DERMABOND ADVANCED (GAUZE/BANDAGES/DRESSINGS) ×2
DERMABOND ADVANCED .7 DNX12 (GAUZE/BANDAGES/DRESSINGS) ×1 IMPLANT
DIFFUSER DRILL AIR PNEUMATIC (MISCELLANEOUS) ×3 IMPLANT
DRAPE C-ARM 42X72 X-RAY (DRAPES) IMPLANT
DRAPE LAPAROTOMY 100X72 PEDS (DRAPES) ×3 IMPLANT
DRAPE MICROSCOPE LEICA (MISCELLANEOUS) IMPLANT
DRAPE POUCH INSTRU U-SHP 10X18 (DRAPES) ×3 IMPLANT
DRAPE SURG 17X23 STRL (DRAPES) ×12 IMPLANT
DRSG OPSITE POSTOP 4X6 (GAUZE/BANDAGES/DRESSINGS) ×3 IMPLANT
DURAPREP 26ML APPLICATOR (WOUND CARE) ×3 IMPLANT
ELECT REM PT RETURN 9FT ADLT (ELECTROSURGICAL) ×3
ELECTRODE REM PT RTRN 9FT ADLT (ELECTROSURGICAL) ×1 IMPLANT
GAUZE SPONGE 4X4 12PLY STRL (GAUZE/BANDAGES/DRESSINGS) ×3 IMPLANT
GAUZE SPONGE 4X4 16PLY XRAY LF (GAUZE/BANDAGES/DRESSINGS) IMPLANT
GLOVE BIO SURGEON STRL SZ7 (GLOVE) IMPLANT
GLOVE BIO SURGEON STRL SZ8 (GLOVE) ×3 IMPLANT
GLOVE BIOGEL PI IND STRL 7.0 (GLOVE) IMPLANT
GLOVE BIOGEL PI INDICATOR 7.0 (GLOVE)
GLOVE EXAM NITRILE LRG STRL (GLOVE) IMPLANT
GLOVE EXAM NITRILE XL STR (GLOVE) IMPLANT
GLOVE EXAM NITRILE XS STR PU (GLOVE) IMPLANT
GLOVE INDICATOR 8.5 STRL (GLOVE) ×3 IMPLANT
GOWN STRL REUS W/ TWL LRG LVL3 (GOWN DISPOSABLE) IMPLANT
GOWN STRL REUS W/ TWL XL LVL3 (GOWN DISPOSABLE) ×1 IMPLANT
GOWN STRL REUS W/TWL 2XL LVL3 (GOWN DISPOSABLE) IMPLANT
GOWN STRL REUS W/TWL LRG LVL3 (GOWN DISPOSABLE)
GOWN STRL REUS W/TWL XL LVL3 (GOWN DISPOSABLE) ×2
KIT BASIN OR (CUSTOM PROCEDURE TRAY) ×3 IMPLANT
KIT ROOM TURNOVER OR (KITS) ×3 IMPLANT
MARKER SKIN DUAL TIP RULER LAB (MISCELLANEOUS) ×3 IMPLANT
NEEDLE HYPO 25X1 1.5 SAFETY (NEEDLE) ×3 IMPLANT
NEEDLE SPNL 20GX3.5 QUINCKE YW (NEEDLE) ×3 IMPLANT
NS IRRIG 1000ML POUR BTL (IV SOLUTION) ×3 IMPLANT
OIL CARTRIDGE MAESTRO DRILL (MISCELLANEOUS) ×3
PACK LAMINECTOMY NEURO (CUSTOM PROCEDURE TRAY) ×3 IMPLANT
PAD ARMBOARD 7.5X6 YLW CONV (MISCELLANEOUS) ×9 IMPLANT
PIN MAYFIELD SKULL DISP (PIN) ×3 IMPLANT
RUBBERBAND STERILE (MISCELLANEOUS) IMPLANT
SPONGE LAP 4X18 X RAY DECT (DISPOSABLE) IMPLANT
SPONGE SURGIFOAM ABS GEL 100 (HEMOSTASIS) ×3 IMPLANT
STRIP CLOSURE SKIN 1/2X4 (GAUZE/BANDAGES/DRESSINGS) ×2 IMPLANT
SUT ETHILON 4 0 PS 2 18 (SUTURE) IMPLANT
SUT VIC AB 0 CT1 18XCR BRD8 (SUTURE) ×1 IMPLANT
SUT VIC AB 0 CT1 8-18 (SUTURE) ×2
SUT VIC AB 2-0 CT1 18 (SUTURE) ×3 IMPLANT
SUT VIC AB 4-0 PS2 27 (SUTURE) ×3 IMPLANT
TOWEL GREEN STERILE (TOWEL DISPOSABLE) ×3 IMPLANT
TOWEL GREEN STERILE FF (TOWEL DISPOSABLE) ×3 IMPLANT
TRAY FOLEY W/METER SILVER 16FR (SET/KITS/TRAYS/PACK) IMPLANT
WATER STERILE IRR 1000ML POUR (IV SOLUTION) ×3 IMPLANT

## 2017-06-19 NOTE — Transfer of Care (Signed)
Immediate Anesthesia Transfer of Care Note  Patient: Sierra Leach  Procedure(s) Performed: Cervical seven-Thoracic one removal of hardware (N/A Neck)  Patient Location: PACU  Anesthesia Type:General  Level of Consciousness: awake, alert  and oriented  Airway & Oxygen Therapy: Patient Spontanous Breathing and Patient connected to nasal cannula oxygen  Post-op Assessment: Report given to RN and Post -op Vital signs reviewed and stable  Post vital signs: Reviewed and stable  Last Vitals:  Vitals:   06/19/17 1629 06/19/17 1630  BP: (!) 168/109   Pulse: 74 79  Resp: 11 13  Temp: 36.8 C   SpO2: 100% 97%    Last Pain:  Vitals:   06/19/17 1629  TempSrc:   PainSc: 0-No pain      Patients Stated Pain Goal: 2 (06/19/17 1046)  Complications: No apparent anesthesia complications

## 2017-06-19 NOTE — Progress Notes (Signed)
Pharmacy Antibiotic Note  Sierra GottronWanda C Leach is a 66 y.o. female admitted on 06/19/2017 with surgical prophylaxis.  Pharmacy has been consulted for vancomycin dosing.  Patient does not have drain, so will give 1 dose as ordered.  Had preop dose at 1438 PM.  Plan: 1. Vancomycin 1500 mg x 1 at 2 AM. 2. Pharmacy will sign off.  Height: 5\' 3"  (160 cm) Weight: 210 lb (95.3 kg) IBW/kg (Calculated) : 52.4  Temp (24hrs), Avg:98.3 F (36.8 C), Min:98.2 F (36.8 C), Max:98.3 F (36.8 C)  Recent Labs  Lab 06/19/17 1039  WBC 5.3  CREATININE 1.05*    Estimated Creatinine Clearance: 59.5 mL/min (A) (by C-G formula based on SCr of 1.05 mg/dL (H)).    Allergies  Allergen Reactions  . Penicillins Hives, Itching, Rash and Other (See Comments)    Has patient had a PCN reaction causing immediate rash, facial/tongue/throat swelling, SOB or lightheadedness with hypotension: Yes Has patient had a PCN reaction causing severe rash involving mucus membranes or skin necrosis: No Has patient had a PCN reaction that required hospitalization: No Has patient had a PCN reaction occurring within the last 10 years: No If all of the above answers are "NO", then may proceed with Cephalosporin use.   . Sulfa Antibiotics Other (See Comments)    Breaks mouth out in blisters  . Adhesive [Tape] Rash     Thank you for allowing pharmacy to be a part of this patient's care.  Tad MooreJessica Edwyna Dangerfield, Pharm D, BCPS  Clinical Pharmacist Pager [phone]  06/19/2017 5:45 PM

## 2017-06-19 NOTE — Anesthesia Preprocedure Evaluation (Signed)
Anesthesia Evaluation  Patient identified by MRN, date of birth, ID band Patient awake    Reviewed: Allergy & Precautions, NPO status , Patient's Chart, lab work & pertinent test results  Airway Mallampati: III  TM Distance: >3 FB Neck ROM: Limited    Dental  (+) Teeth Intact, Dental Advisory Given   Pulmonary    breath sounds clear to auscultation       Cardiovascular hypertension,  Rhythm:Regular Rate:Normal     Neuro/Psych    GI/Hepatic   Endo/Other    Renal/GU      Musculoskeletal   Abdominal (+) + obese,   Peds  Hematology   Anesthesia Other Findings   Reproductive/Obstetrics                             Anesthesia Physical Anesthesia Plan  ASA: III  Anesthesia Plan: General   Post-op Pain Management:    Induction: Intravenous  PONV Risk Score and Plan: Ondansetron, Dexamethasone and Propofol infusion  Airway Management Planned: Oral ETT  Additional Equipment:   Intra-op Plan:   Post-operative Plan: Extubation in OR  Informed Consent: I have reviewed the patients History and Physical, chart, labs and discussed the procedure including the risks, benefits and alternatives for the proposed anesthesia with the patient or authorized representative who has indicated his/her understanding and acceptance.   Dental advisory given  Plan Discussed with: CRNA and Anesthesiologist  Anesthesia Plan Comments: (H/O post-op nausea and vomiting htn CVA 1999 mild L.LE weakness H/O pulmonary  Embolus 2014   Plan GA with zofran, decadron, propfol infusion, scopolamine patch  Plan GA with oral ETT  Kipp Broodavid Meya Clutter )        Anesthesia Quick Evaluation

## 2017-06-19 NOTE — Anesthesia Procedure Notes (Signed)
Procedure Name: Intubation Date/Time: 06/19/2017 3:20 PM Performed by: Chauncey Sciulli T, CRNA Pre-anesthesia Checklist: Patient identified, Emergency Drugs available, Suction available and Patient being monitored Patient Re-evaluated:Patient Re-evaluated prior to induction Oxygen Delivery Method: Circle system utilized Preoxygenation: Pre-oxygenation with 100% oxygen Induction Type: IV induction Ventilation: Mask ventilation without difficulty Laryngoscope Size: Miller and 2 Grade View: Grade I Tube type: Oral Tube size: 7.5 mm Number of attempts: 1 Airway Equipment and Method: Patient positioned with wedge pillow and Stylet Placement Confirmation: ETT inserted through vocal cords under direct vision,  positive ETCO2 and breath sounds checked- equal and bilateral Secured at: 21 cm Tube secured with: Tape Dental Injury: Teeth and Oropharynx as per pre-operative assessment

## 2017-06-19 NOTE — H&P (Signed)
Sierra Leach is an 66 y.o. female.   Chief Complaint: neck pain HPI: 66 year old female with history of previous posterior cervical fusion at displacement of her hardware had a be revised and she is now displaced her left C6 lateral mass screw as well she is not floridly unstable does not has no motion with flexion extension films she has never been operated on on her right side at this point because she failed to twice and remove the hardware I do not think she is any further decompression I do not think she has to have a fusion I do think she is partially fused with bone graft laid down over the previous 2 operations.  Past Medical History:  Diagnosis Date  . Anxiety   . Arthritis   . Depression   . GERD (gastroesophageal reflux disease)    occ  . Headache    used to have migraines  . History of kidney stones   . Hypertension   . Lumbar back pain   . Peripheral vascular disease ([place]) 2013   pulmonary embolus  . Pneumonia   . PONV (postoperative nausea and vomiting)   . Stroke ([place]) 99   left leg weakness    Past Surgical History:  Procedure Laterality Date  . ABDOMINAL HYSTERECTOMY    . ANTERIOR CERVICAL DECOMP/DISCECTOMY FUSION N/A 02/06/2016   Procedure: ANTERIOR CERVICAL DECOMPRESSION FUSION CERVICAL THREE-FOUR WITH REMOVAL OF PLATE CERVICAL FOUR-SEVEN  ;  Surgeon: Kary Kos, MD;  Location: [place] [facility];  Service: Neurosurgery;  Laterality: N/A;  . BACK SURGERY     x8  . bone spurs     multiple  . BREAST SURGERY Left    lumpectomy x3  . COLONOSCOPY    . LUMBAR WOUND DEBRIDEMENT N/A 09/04/2013   Procedure: irrigation and debridement of lumbar wound;  Surgeon: Bonna Gains, MD;  Location: [place] [facility];  Service: Neurosurgery;  Laterality: N/A;  irrigation and debridement of lumbar wound  . POSTERIOR CERVICAL FUSION/FORAMINOTOMY Left 12/19/2016   Procedure: Posterior Cervical Fusion with lateral mass fixation - Cervical Seven-Thoracic one with Diskectomy left;  Surgeon: Kary Kos, MD;  Location: [place] [place];  Service: Neurosurgery;  Laterality: Left;  . POSTERIOR CERVICAL FUSION/FORAMINOTOMY N/A 05/15/2017   Procedure: Posterior Cervical Fusion with lateral mass fixation - Cervical seven-Thoracic one revision;  Surgeon: Kary Kos, MD;  Location: [place];  Service: Neurosurgery;  Laterality: N/A;  . SHOULDER ARTHROSCOPY Left 03   rotatoer cuff  . SHOULDER ARTHROSCOPY Right 99   bone spur    Family History  Problem Relation Age of Onset  . High arches Mother   . Neuropathy Mother   . COPD Father   . High blood pressure Sister   . Diabetes Brother   . Parkinson's disease Brother    Social History:  reports that  has never smoked. she has never used smokeless tobacco. She reports that she does not drink alcohol or use drugs.  Allergies:  Allergies  Allergen Reactions  . Penicillins Hives, Itching, Rash and Other (See Comments)    Has patient had a PCN reaction causing immediate rash, facial/tongue/throat swelling, SOB or lightheadedness with hypotension: Yes Has patient had a PCN reaction causing severe rash involving mucus membranes or skin necrosis: No Has patient had a PCN reaction that required hospitalization: No Has patient had a PCN reaction occurring within the last 10 years: No If all of the above answers are "NO", then may proceed with Cephalosporin use.   Marland Kitchen  Sulfa Antibiotics Other (See Comments)    Breaks mouth out in blisters  . Adhesive [Tape] Rash    Medications Prior to Admission  Medication Sig Dispense Refill  . acetaminophen (TYLENOL) 500 MG tablet Take 1,500 mg by mouth every 6 (six) hours as needed for mild pain or headache.     . allopurinol (ZYLOPRIM) 100 MG tablet Take 200 mg by mouth at bedtime.     . carvedilol (COREG) 12.5 MG tablet Take 12.5 mg by mouth 2 (two) times daily with a meal.    . citalopram (CELEXA) 20 MG tablet Take 20 mg by mouth daily.     . diazepam (VALIUM) 5 MG tablet Take 5 mg by mouth daily as needed for anxiety.      . diphenhydrAMINE (BENADRYL) 25 mg capsule Take 25 mg by mouth at bedtime.     Marland Kitchen HYDROcodone-acetaminophen (NORCO/VICODIN) 5-325 MG tablet Take 1 tablet by mouth every 6 (six) hours as needed for moderate pain. (Patient taking differently: Take 1-2 tablets by mouth every 4 (four) hours as needed for moderate pain. ) 30 tablet 0  . oxyCODONE (OXY IR/ROXICODONE) 5 MG immediate release tablet Take 5 mg by mouth every 3 (three) hours as needed for severe pain.    Marland Kitchen sennosides-docusate sodium (SENOKOT-S) 8.6-50 MG tablet Take 2 tablets by mouth at bedtime.    Marland Kitchen tiZANidine (ZANAFLEX) 2 MG tablet Take 2 mg by mouth every 4 (four) hours as needed for muscle spasms.   3  . warfarin (COUMADIN) 3 MG tablet Take 3-6 mg by mouth See admin instructions. Take 3 mg by mouth daily at night except on Mondays take 6 mg at night     . meclizine (ANTIVERT) 25 MG tablet Take 25 mg by mouth every 6 (six) hours as needed for dizziness.      Results for orders placed or performed during the hospital encounter of 06/19/17 (from the past 48 hour(s))  Basic metabolic panel     Status: Abnormal   Collection Time: 06/19/17 10:39 AM  Result Value Ref Range   Sodium 136 135 - 145 mmol/L   Potassium 6.0 (H) 3.5 - 5.1 mmol/L    Comment: SPECIMEN HEMOLYZED. HEMOLYSIS MAY AFFECT INTEGRITY OF RESULTS.   Chloride 104 101 - 111 mmol/L   CO2 20 (L) 22 - 32 mmol/L   Glucose, Bld 108 (H) 65 - 99 mg/dL   BUN 16 6 - 20 mg/dL   Creatinine, Ser 1.05 (H) 0.44 - 1.00 mg/dL   Calcium 8.8 (L) 8.9 - 10.3 mg/dL   GFR calc non Af Amer 55 (L) >60 mL/min   GFR calc Af Amer >60 >60 mL/min    Comment: (NOTE) The eGFR has been calculated using the CKD EPI equation. This calculation has not been validated in all clinical situations. eGFR's persistently <60 mL/min signify possible Chronic Kidney Disease.    Anion gap 12 5 - 15  PT- INR Day of Surgery     Status: None   Collection Time: 06/19/17 10:39 AM  Result Value Ref Range    Prothrombin Time 12.7 11.4 - 15.2 seconds   INR 0.96   CBC     Status: Abnormal   Collection Time: 06/19/17 10:39 AM  Result Value Ref Range   WBC 5.3 4.0 - 10.5 K/uL   RBC 4.57 3.87 - 5.11 MIL/uL   Hemoglobin 11.8 (L) 12.0 - 15.0 g/dL   HCT 38.5 36.0 - 46.0 %   MCV 84.2 78.0 - 100.0  fL   MCH 25.8 (L) 26.0 - 34.0 pg   MCHC 30.6 30.0 - 36.0 g/dL   RDW 16.4 (H) 11.5 - 15.5 %   Platelets 203 150 - 400 K/uL  I-STAT 4, (NA,K, GLUC, HGB,HCT)     Status: Abnormal   Collection Time: 06/19/17  2:02 PM  Result Value Ref Range   Sodium 141 135 - 145 mmol/L   Potassium 3.5 3.5 - 5.1 mmol/L   Glucose, Bld 91 65 - 99 mg/dL   HCT 32.0 (L) 36.0 - 46.0 %   Hemoglobin 10.9 (L) 12.0 - 15.0 g/dL   No results found.  Review of Systems  Musculoskeletal: Positive for myalgias and neck pain.    Blood pressure (!) 143/84, pulse 61, temperature 98.3 F (36.8 C), temperature source Oral, resp. rate 18, height _0  (1.6 m), weight 95.3 kg (210 lb), SpO2 99 %. Physical Exam  Constitutional: She is oriented to person, place, and time. She appears well-developed.  HENT:  Head: Normocephalic.  Neck: Normal range of motion.  GI: Soft.  Neurological: She is alert and oriented to person, place, and time. She has normal strength. GCS eye subscore is 4. GCS verbal subscore is 5. GCS motor subscore is 6.  This is awake alert strength is 5 out of 5 upper and lower extremities  Skin: Skin is warm and dry.     Assessment/Plan 66 year old female with neck pain and displacement of her left C6 lateral mass screw present for removal of hardware  Nashly Olsson P, MD 06/19/2017, 2:39 PM

## 2017-06-19 NOTE — Op Note (Signed)
Preoperative diagnosis: Painful hardware and displaced left C6 lateral mass screw  Postoperative diagnosis: Same  Procedure: Exploration of fusion removal of hardware C6-T1 left  Surgeon: Jillyn HiddenGary Yao Hyppolite  Anesthesia: Gen.  EBL: Minimal  History of present illness: Patient is a [age] year female has had previous laminotomy discectomy C7-T1 underwent placement of lateral mass screw C7 pedicle screw T1 displaced lateral mass screws C7 was extended to C6 and displaced C6 screw as well patient had achieved a partial fusion spell that because uses only a unilateral decompression achieved proper stabilizer just remove remove the hardware and that's what I recommended to her. I extensively went over the risks and benefits of the operation the patient as well as perioperative course expectations of outcome and alternatives of surgery and she understands and agrees to proceed forward.  Operative procedure: Patient brought in the or was induced under general anesthesia positioned prone in pins back of her head and neck were shaved prepped and draped in routine sterile fashion her old incision was opened up after being infiltrated with 5 mL lidocaine with epi scar tissues dissected free and dissected down to the hardware on the left. I disconnected the nuts and removed the rod the left C7 screw was freely floating remove this I removed the left T1 pedicle screw.there was a neck guide large amount of bone graft that was partially fused extending from the lateral masses C6 down to T1. I waxed holes was irrigated the wound and closed in layers with after Vicryl screws current running 4 septic or Dermabond benzo and Steri-Strips and sterile dressings applied patient recovered in stable condition.  The case all needle counts sponge counts were correct.

## 2017-06-20 LAB — PROTIME-INR
INR: 1.11
Prothrombin Time: 14.2 seconds (ref 11.4–15.2)

## 2017-06-20 NOTE — Discharge Summary (Signed)
  Physician Discharge Summary  Patient ID: Sierra Leach MRN: [id number] DOB/AGE: 1951-03-02 66 y.o.  Admit date: 06/19/2017 Discharge date: 06/20/2017  Admission Diagnoses: Painful hardware  Discharge Diagnoses: Same Active Problems:   Painful orthopaedic hardware [facility](HCC)   Discharged Condition: good  Hospital Course: Patient is a hospital underwent expiration of fusion removal of hardware postoperative patient did very well recovered in the floor on the floor was angling and voiding significant improvement in pain was stable for discharge patient be discharged scheduled follow-up in one to 2 weeks.  Consults: Significant Diagnostic Studies: Treatments: Expiration of fusion removal of hardware Discharge Exam: Blood pressure 111/78, pulse 60, temperature (!) 97.5 F (36.4 C), temperature source Oral, resp. rate 20, height 5\' 3"  (1.6 m), weight 95.3 kg (210 lb), SpO2 98 %. Strength out of 5 wound clean dry and intact  Disposition: Home   Allergies as of 06/20/2017      Reactions   Penicillins Hives, Itching, Rash, Other (See Comments)   Has patient had a PCN reaction causing immediate rash, facial/tongue/throat swelling, SOB or lightheadedness with hypotension: Yes Has patient had a PCN reaction causing severe rash involving mucus membranes or skin necrosis: No Has patient had a PCN reaction that required hospitalization: No Has patient had a PCN reaction occurring within the last 10 years: No If all of the above answers are "NO", then may proceed with Cephalosporin use.   Sulfa Antibiotics Other (See Comments)   Breaks mouth out in blisters   Adhesive [tape] Rash      Medication List    TAKE these medications   acetaminophen 500 MG tablet Commonly known as:  TYLENOL Take 1,500 mg by mouth every 6 (six) hours as needed for mild pain or headache.   allopurinol 100 MG tablet Commonly known as:  ZYLOPRIM Take 200 mg by mouth at bedtime.   BENADRYL 25 mg capsule Generic  drug:  diphenhydrAMINE Take 25 mg by mouth at bedtime.   carvedilol 12.5 MG tablet Commonly known as:  COREG Take 12.5 mg by mouth 2 (two) times daily with a meal.   citalopram 20 MG tablet Commonly known as:  CELEXA Take 20 mg by mouth daily.   diazepam 5 MG tablet Commonly known as:  VALIUM Take 5 mg by mouth daily as needed for anxiety.   HYDROcodone-acetaminophen 5-325 MG tablet Commonly known as:  NORCO/VICODIN Take 1 tablet by mouth every 6 (six) hours as needed for moderate pain. What changed:    how much to take  when to take this   meclizine 25 MG tablet Commonly known as:  ANTIVERT Take 25 mg by mouth every 6 (six) hours as needed for dizziness.   oxyCODONE 5 MG immediate release tablet Commonly known as:  Oxy IR/ROXICODONE Take 5 mg by mouth every 3 (three) hours as needed for severe pain.   sennosides-docusate sodium 8.6-50 MG tablet Commonly known as:  SENOKOT-S Take 2 tablets by mouth at bedtime.   tiZANidine 2 MG tablet Commonly known as:  ZANAFLEX Take 2 mg by mouth every 4 (four) hours as needed for muscle spasms.   warfarin 3 MG tablet Commonly known as:  COUMADIN Take 3-6 mg by mouth See admin instructions. Take 3 mg by mouth daily at night except on Mondays take 6 mg at night        Signed: Riannon Mukherjee P 06/20/2017, 11:48 AM

## 2017-06-20 NOTE — Progress Notes (Signed)
Patient alert and oriented, mae's well, voiding adequate amount of urine, swallowing without difficulty, no c/o pain at time of discharge. Patient discharged home with family. Script and discharged instructions given to patient. Patient and family stated understanding of instructions given. Patient has an appointment with Dr. Cram 

## 2017-06-20 NOTE — Discharge Instructions (Signed)
Wound Care  Keep the incision clean and dry remove the outer dressing in 2 days, leave the Steri-Strips intact. Wrap with Saran wrap for showers only Do not put any creams, lotions, or ointments on incision. Leave steri-strips on neck.  They will fall off by themselves.  Activity Walk each and every day, increasing distance each day. No lifting greater than 5 lbs.  Avoid excessive neck motion. No lifting no bending no twisting no driving or riding a car unless coming back and forth to see me.   Diet Resume your normal diet.   Return to Work Will be discussed at you follow up appointment.  Call Your Doctor If Any of These Occur Redness, drainage, or swelling at the wound.  Temperature greater than 101 degrees. Severe pain not relieved by pain medication. Incision starts to come apart. Follow Up Appt Call today for appointment in 1-2 weeks [phone] or for problems.  If you have any hardware placed in your spine, you will need an x-ray before your appointment.

## 2017-06-20 NOTE — Anesthesia Postprocedure Evaluation (Signed)
Anesthesia Post Note  Patient: Sierra Leach  Procedure(s) Performed: Cervical seven-Thoracic one removal of hardware (N/A Neck)     Patient location during evaluation: PACU Anesthesia Type: General Level of consciousness: awake, awake and alert and oriented Pain management: pain level controlled Vital Signs Assessment: post-procedure vital signs reviewed and stable Respiratory status: spontaneous breathing, nonlabored ventilation and respiratory function stable Cardiovascular status: blood pressure returned to baseline Anesthetic complications: no    Last Vitals:  Vitals:   06/20/17 0000 06/20/17 0400  BP: 101/62 (!) 130/95  Pulse: 75 74  Resp: 18 20  Temp: 36.6 C 36.6 C  SpO2: 98% 94%    Last Pain:  Vitals:   06/20/17 0423  TempSrc:   PainSc: 3                  Murrell Dome COKER

## 2017-06-21 ENCOUNTER — Encounter (HOSPITAL_COMMUNITY): Payer: Self-pay | Admitting: Neurosurgery

## 2018-02-21 ENCOUNTER — Other Ambulatory Visit: Payer: Self-pay | Admitting: Neurosurgery

## 2018-02-21 DIAGNOSIS — S32009K Unspecified fracture of unspecified lumbar vertebra, subsequent encounter for fracture with nonunion: Secondary | ICD-10-CM

## 2018-05-19 ENCOUNTER — Other Ambulatory Visit: Payer: Self-pay | Admitting: Neurosurgery

## 2018-05-19 DIAGNOSIS — M542 Cervicalgia: Secondary | ICD-10-CM

## 2018-05-29 ENCOUNTER — Other Ambulatory Visit: Payer: Self-pay | Admitting: Neurosurgery

## 2018-05-29 DIAGNOSIS — M5414 Radiculopathy, thoracic region: Secondary | ICD-10-CM

## 2018-05-30 ENCOUNTER — Ambulatory Visit
Admission: RE | Admit: 2018-05-30 | Discharge: 2018-05-30 | Disposition: A | Discharge status: Home / Self Care | Payer: Medicare HMO | Source: Ambulatory Visit | Attending: Neurosurgery | Admitting: Neurosurgery

## 2018-05-30 DIAGNOSIS — M542 Cervicalgia: Secondary | ICD-10-CM

## 2018-05-30 MED ORDER — IOPAMIDOL (ISOVUE-M 300) INJECTION 61%: 1.0000 mL | Freq: Once | INTRAMUSCULAR | Status: DC | PRN

## 2018-05-30 MED ORDER — TRIAMCINOLONE ACETONIDE 40 MG/ML IJ SUSP (RADIOLOGY): 60.0000 mg | Freq: Once | INTRAMUSCULAR | Status: DC

## 2018-05-30 NOTE — Discharge Instructions (Signed)

## 2018-06-26 ENCOUNTER — Ambulatory Visit
Admission: RE | Admit: 2018-06-26 | Discharge: 2018-06-26 | Disposition: A | Discharge status: Home / Self Care | Payer: Medicare HMO | Source: Ambulatory Visit | Attending: Neurosurgery | Admitting: Neurosurgery

## 2018-06-26 DIAGNOSIS — M5414 Radiculopathy, thoracic region: Secondary | ICD-10-CM

## 2018-06-26 MED ORDER — TRIAMCINOLONE ACETONIDE 40 MG/ML IJ SUSP (RADIOLOGY)
60.0000 mg | Freq: Once | INTRAMUSCULAR | Status: AC
  Administered 2018-06-26: 60 mg via EPIDURAL

## 2018-06-26 MED ORDER — IOPAMIDOL (ISOVUE-M 300) INJECTION 61%
1.0000 mL | Freq: Once | INTRAMUSCULAR | Status: AC | PRN
  Administered 2018-06-26: 1 mL via EPIDURAL

## 2018-07-21 ENCOUNTER — Other Ambulatory Visit: Payer: Self-pay | Admitting: Student

## 2018-07-21 DIAGNOSIS — M4724 Other spondylosis with radiculopathy, thoracic region: Secondary | ICD-10-CM

## 2018-07-29 ENCOUNTER — Other Ambulatory Visit: Payer: Medicare HMO

## 2018-07-31 ENCOUNTER — Ambulatory Visit
Admission: RE | Admit: 2018-07-31 | Discharge: 2018-07-31 | Disposition: A | Discharge status: Home / Self Care | Payer: Medicare HMO | Source: Ambulatory Visit | Attending: Student | Admitting: Student

## 2018-07-31 DIAGNOSIS — M4724 Other spondylosis with radiculopathy, thoracic region: Secondary | ICD-10-CM

## 2018-07-31 MED ORDER — IOPAMIDOL (ISOVUE-M 300) INJECTION 61%
1.0000 mL | Freq: Once | INTRAMUSCULAR | Status: AC | PRN
  Administered 2018-07-31: 1 mL via EPIDURAL

## 2018-07-31 MED ORDER — TRIAMCINOLONE ACETONIDE 40 MG/ML IJ SUSP (RADIOLOGY)
60.0000 mg | Freq: Once | INTRAMUSCULAR | Status: AC
  Administered 2018-07-31: 60 mg via EPIDURAL

## 2018-07-31 NOTE — Discharge Instructions (Signed)

## 2018-12-31 ENCOUNTER — Other Ambulatory Visit: Payer: Self-pay | Admitting: Neurosurgery

## 2018-12-31 DIAGNOSIS — S129XXA Fracture of neck, unspecified, initial encounter: Secondary | ICD-10-CM

## 2019-01-20 ENCOUNTER — Ambulatory Visit
Admission: RE | Admit: 2019-01-20 | Discharge: 2019-01-20 | Disposition: A | Discharge status: Home / Self Care | Payer: Medicare HMO | Source: Ambulatory Visit | Attending: Neurosurgery | Admitting: Neurosurgery

## 2019-01-20 DIAGNOSIS — S129XXA Fracture of neck, unspecified, initial encounter: Secondary | ICD-10-CM

## 2019-01-20 MED ORDER — IOPAMIDOL (ISOVUE-M 300) INJECTION 61%
1.0000 mL | Freq: Once | INTRAMUSCULAR | Status: AC | PRN
  Administered 2019-01-20: 11:00:00 1 mL via INTRA_ARTICULAR

## 2019-01-20 MED ORDER — DEXAMETHASONE SODIUM PHOSPHATE 4 MG/ML IJ SOLN
6.0000 mg | Freq: Once | INTRAMUSCULAR | Status: AC
  Administered 2019-01-20: 11:00:00 6 mg via INTRA_ARTICULAR

## 2019-01-20 NOTE — Discharge Instructions (Signed)

## 2019-01-22 ENCOUNTER — Other Ambulatory Visit: Payer: Self-pay | Admitting: Neurosurgery

## 2019-01-22 DIAGNOSIS — M5126 Other intervertebral disc displacement, lumbar region: Secondary | ICD-10-CM

## 2019-09-18 ENCOUNTER — Encounter (HOSPITAL_COMMUNITY): Payer: Self-pay

## 2019-09-28 ENCOUNTER — Other Ambulatory Visit (HOSPITAL_COMMUNITY): Payer: Self-pay | Admitting: Neurosurgery

## 2019-09-28 ENCOUNTER — Other Ambulatory Visit: Payer: Self-pay

## 2019-09-28 ENCOUNTER — Ambulatory Visit (HOSPITAL_COMMUNITY)
Admission: RE | Admit: 2019-09-28 | Discharge: 2019-09-28 | Disposition: A | Discharge status: Home / Self Care | Payer: Medicare HMO | Source: Ambulatory Visit | Attending: Surgery | Admitting: Surgery

## 2019-09-28 DIAGNOSIS — R600 Localized edema: Secondary | ICD-10-CM

## 2020-11-16 ENCOUNTER — Other Ambulatory Visit: Payer: Self-pay | Admitting: Neurosurgery

## 2020-11-16 DIAGNOSIS — M5414 Radiculopathy, thoracic region: Secondary | ICD-10-CM

## 2020-11-16 DIAGNOSIS — M542 Cervicalgia: Secondary | ICD-10-CM

## 2020-11-23 ENCOUNTER — Other Ambulatory Visit: Payer: Self-pay | Admitting: Family Medicine

## 2020-11-28 ENCOUNTER — Other Ambulatory Visit: Payer: Self-pay | Admitting: Family Medicine

## 2020-12-07 ENCOUNTER — Other Ambulatory Visit: Payer: Self-pay

## 2020-12-07 ENCOUNTER — Ambulatory Visit
Admission: RE | Admit: 2020-12-07 | Discharge: 2020-12-07 | Disposition: A | Discharge status: Home / Self Care | Payer: Medicare HMO | Source: Ambulatory Visit | Attending: Neurosurgery | Admitting: Neurosurgery

## 2020-12-07 DIAGNOSIS — M542 Cervicalgia: Secondary | ICD-10-CM

## 2020-12-07 DIAGNOSIS — M5414 Radiculopathy, thoracic region: Secondary | ICD-10-CM

## 2020-12-07 MED ORDER — IOPAMIDOL (ISOVUE-M 300) INJECTION 61%
1.0000 mL | Freq: Once | INTRAMUSCULAR | Status: AC | PRN
  Administered 2020-12-07: 1 mL via EPIDURAL

## 2020-12-07 MED ORDER — TRIAMCINOLONE ACETONIDE 40 MG/ML IJ SUSP (RADIOLOGY)
60.0000 mg | Freq: Once | INTRAMUSCULAR | Status: AC
  Administered 2020-12-07: 60 mg via EPIDURAL

## 2020-12-07 NOTE — Discharge Instructions (Signed)
Post Procedure Spinal Discharge Instruction Sheet  1. You may resume a regular diet and any medications that you routinely take (including pain medications).  2. No driving day of procedure.  3. Light activity throughout the rest of the day.  Do not do any strenuous work, exercise, bending or lifting.  The day following the procedure, you can resume normal physical activity but you should refrain from exercising or physical therapy for at least three days thereafter.   Common Side Effects:   Headaches- take your usual medications as directed by your physician.  Increase your fluid intake.  Caffeinated beverages may be helpful.  Lie flat in bed until your headache resolves.   Restlessness or inability to sleep- you may have trouble sleeping for the next few days.  Ask your referring physician if you need any medication for sleep.   Facial flushing or redness- should subside within a few days.   Increased pain- a temporary increase in pain a day or two following your procedure is not unusual.  Take your pain medication as prescribed by your referring physician.   Leg cramps  Please contact our office at [phone] for the following symptoms:  Fever greater than 100 degrees.  Headaches unresolved with medication after 2-3 days.  Increased swelling, pain, or redness at injection site.   Thank you for visiting [facility] today.   YOU MAY RESUME YOUR COUMADIN (WARFARIN) 12 HOURS AFTER INJECTION (11:30 PM TODAY OR AFTER)

## 2020-12-13 ENCOUNTER — Other Ambulatory Visit: Payer: Self-pay | Admitting: Neurosurgery

## 2020-12-13 DIAGNOSIS — M542 Cervicalgia: Secondary | ICD-10-CM

## 2020-12-14 ENCOUNTER — Other Ambulatory Visit: Payer: Self-pay | Admitting: Family Medicine

## 2021-03-09 ENCOUNTER — Other Ambulatory Visit: Payer: Self-pay | Admitting: Neurosurgery

## 2021-03-21 NOTE — Pre-Procedure Instructions (Signed)
Surgical Instructions    Your procedure is scheduled on Monday 03/27/21.   Report to [facility] Main Entrance "A" at 08:00 A.M., then check in with the Admitting office.  Call this number if you have problems the morning of surgery:  [phone]   If you have any questions prior to your surgery date call [phone]: Open Monday-Friday 8am-4pm    Remember:  Do not eat or drink after midnight the night before your surgery     Take these medicines the morning of surgery with A SIP OF WATER    allopurinol (ZYLOPRIM)   carvedilol (COREG)  citalopram (CELEXA)  estradiol (ESTRACE)   Take these medicines if needed:   acetaminophen (TYLENOL)   meclizine (ANTIVERT)  Tetrahydrozoline HCl (VISINE OP)  Please follow your surgeon's instructions regarding warfarin (COUMADIN). If you have not received instructions then you need to contact your surgeon's office for instructions.   As of today, STOP taking any Aspirin (unless otherwise instructed by your surgeon) Aleve, Naproxen, Ibuprofen, Motrin, Advil, Goody's, BC's, all herbal medications, fish oil, and all vitamins.          Do not wear jewelry or makeup Do not wear lotions, powders, perfumes/colognes, or deodorant. Do not shave 48 hours prior to surgery.  Men may shave face and neck. Do not bring valuables to the hospital. DO Not wear nail polish, gel polish, artificial nails, or any other type of covering on natural nails including finger and toenails. If patients have artificial nails, gel coating, etc. that need to be removed by a nail salon please have this removed prior to surgery or surgery may need to be canceled/delayed if the surgeon/ anesthesia feels like the patient is unable to be adequately monitored.             [place] is not responsible for any belongings or valuables.  Do NOT Smoke (Tobacco/Vaping)  24 hours prior to your procedure If you use a CPAP at night, you may bring your mask for your overnight stay.    Contacts, glasses, dentures or bridgework may not be worn into surgery, please bring cases for these belongings   For patients admitted to the hospital, discharge time will be determined by your treatment team.   Patients discharged the day of surgery will not be allowed to drive home, and someone needs to stay with them for 24 hours.  ONLY 1 SUPPORT PERSON MAY BE PRESENT WHILE YOU ARE IN SURGERY. IF YOU ARE TO BE ADMITTED ONCE YOU ARE IN YOUR ROOM YOU WILL BE ALLOWED TWO (2) VISITORS.  Minor children may have two parents present. Special consideration for safety and communication needs will be reviewed on a case by case basis.  Special instructions:    Oral Hygiene is also important to reduce your risk of infection.  Remember - BRUSH YOUR TEETH THE MORNING OF SURGERY WITH YOUR REGULAR TOOTHPASTE   [place]- Preparing For Surgery  Before surgery, you can play an important role. Because skin is not sterile, your skin needs to be as free of germs as possible. You can reduce the number of germs on your skin by washing with CHG (chlorahexidine gluconate) Soap before surgery.  CHG is an antiseptic cleaner which kills germs and bonds with the skin to continue killing germs even after washing.     Please do not use if you have an allergy to CHG or antibacterial soaps. If your skin becomes reddened/irritated stop using the CHG.  Do not shave (including  legs and underarms) for at least 48 hours prior to first CHG shower. It is OK to shave your face.  Please follow these instructions carefully.     Shower the NIGHT BEFORE SURGERY and the MORNING OF SURGERY with CHG Soap.   If you chose to wash your hair, wash your hair first as usual with your normal shampoo. After you shampoo, rinse your hair and body thoroughly to remove the shampoo.  Then [employer] and genitals (private parts) with your normal soap and rinse thoroughly to remove soap.  After that Use CHG Soap as you would any other liquid  soap. You can apply CHG directly to the skin and wash gently with a scrungie or a clean washcloth.   Apply the CHG Soap to your body ONLY FROM THE NECK DOWN.  Do not use on open wounds or open sores. Avoid contact with your eyes, ears, mouth and genitals (private parts). Wash Face and genitals (private parts)  with your normal soap.   Wash thoroughly, paying special attention to the area where your surgery will be performed.  Thoroughly rinse your body with warm water from the neck down.  DO NOT shower/wash with your normal soap after using and rinsing off the CHG Soap.  Pat yourself dry with a CLEAN TOWEL.  Wear CLEAN PAJAMAS to bed the night before surgery  Place CLEAN SHEETS on your bed the night before your surgery  DO NOT SLEEP WITH PETS.   Day of Surgery:  Take a shower with CHG soap. Wear Clean/Comfortable clothing the morning of surgery Do not apply any deodorants/lotions.   Remember to brush your teeth WITH YOUR REGULAR TOOTHPASTE.   Please read over the following fact sheets that you were given.

## 2021-03-23 ENCOUNTER — Encounter (HOSPITAL_COMMUNITY): Payer: Self-pay

## 2021-03-23 ENCOUNTER — Other Ambulatory Visit: Payer: Self-pay

## 2021-03-23 ENCOUNTER — Encounter (HOSPITAL_COMMUNITY)
Admission: RE | Admit: 2021-03-23 | Discharge: 2021-03-23 | Disposition: A | Discharge status: Home / Self Care | Payer: Medicare HMO | Source: Ambulatory Visit | Attending: Neurosurgery | Admitting: Neurosurgery

## 2021-03-23 DIAGNOSIS — Z01818 Encounter for other preprocedural examination: Secondary | ICD-10-CM | POA: Diagnosis present

## 2021-03-23 DIAGNOSIS — Z7901 Long term (current) use of anticoagulants: Secondary | ICD-10-CM | POA: Insufficient documentation

## 2021-03-23 DIAGNOSIS — Z86711 Personal history of pulmonary embolism: Secondary | ICD-10-CM | POA: Insufficient documentation

## 2021-03-23 DIAGNOSIS — Z20822 Contact with and (suspected) exposure to covid-19: Secondary | ICD-10-CM | POA: Diagnosis not present

## 2021-03-23 LAB — BASIC METABOLIC PANEL
Anion gap: 9 (ref 5–15)
BUN: 15 mg/dL (ref 8–23)
CO2: 24 mmol/L (ref 22–32)
Calcium: 8.7 mg/dL — ABNORMAL LOW (ref 8.9–10.3)
Chloride: 105 mmol/L (ref 98–111)
Creatinine, Ser: 1.01 mg/dL — ABNORMAL HIGH (ref 0.44–1.00)
GFR, Estimated: >60 mL/min (ref >60)
Glucose, Bld: 66 mg/dL — ABNORMAL LOW (ref 70–99)
Potassium: 3.9 mmol/L (ref 3.5–5.1)
Sodium: 138 mmol/L (ref 135–145)

## 2021-03-23 LAB — TYPE AND SCREEN
ABO/RH(D): B POS
Antibody Screen: NEGATIVE

## 2021-03-23 LAB — CBC
HCT: 40.8 % (ref 36.0–46.0)
Hemoglobin: 13.3 g/dL (ref 12.0–15.0)
MCH: 31.6 pg (ref 26.0–34.0)
MCHC: 32.6 g/dL (ref 30.0–36.0)
MCV: 96.9 fL (ref 80.0–100.0)
Platelets: 194 10*3/uL (ref 150–400)
RBC: 4.21 MIL/uL (ref 3.87–5.11)
RDW: 12.6 % (ref 11.5–15.5)
WBC: 5.9 10*3/uL (ref 4.0–10.5)
nRBC: 0 % (ref 0.0–0.2)

## 2021-03-23 LAB — SURGICAL PCR SCREEN
MRSA, PCR: POSITIVE — AB
Staphylococcus aureus: POSITIVE — AB

## 2021-03-23 LAB — SARS CORONAVIRUS 2 (TAT 6-24 HRS): SARS Coronavirus 2: NEGATIVE

## 2021-03-23 NOTE — Progress Notes (Signed)
PCP: Carmin Richmond, NP-C Cardiologist: denies  EKG: 03/23/21 CXR: na ECHO: denies Stress Test:denies Cardiac Cath:denies  Fasting Blood Sugar- na Checks Blood Sugar_na _ times a day  OSA/CPAP: No  ASA: No Blood Thinner: Last dose Coumadin 03/21/21  Covid test 03/23/21 at PAT  Anesthesia Review: Yes, blood thinner  Patient denies shortness of breath, fever, cough, and chest pain at PAT appointment.  Patient verbalized understanding of instructions provided today at the PAT appointment.  Patient asked to review instructions at home and day of surgery.

## 2021-03-23 NOTE — Progress Notes (Signed)
[employer] sent to dr. 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

## 2021-03-24 NOTE — Progress Notes (Signed)
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  Patient reported last dose Coumadin 03/21/2021.  Longstanding history of abnormal EKG with lateral T wave changes.  EKG done in PAT 03/23/2021 appears similar.  Echo 2020 at [facility] showed EF 65 to 70%, mild MR.  Preop labs reviewed, unremarkable.  TTE 06/17/2019 (Care Everywhere): SUMMARY  The left ventricular size is normal.  Mild left ventricular hypertrophy   Left ventricular systolic function is normal.  LV ejection fraction = 65-70%.   Left ventricular filling pattern is prolonged relaxation.  The left ventricular wall motion is normal.  The right ventricular systolic function is normal.  There is mild mitral regurgitation.  The aortic sinus is normal size.  IVC size was normal.  There is no pericardial effusion.  There is no comparison study available.    Zannie Cove [facility] Short Stay Center/Anesthesiology Phone [phone] 03/24/2021 10:08 AM

## 2021-03-24 NOTE — Anesthesia Preprocedure Evaluation (Addendum)
Anesthesia Evaluation  Patient identified by MRN, date of birth, ID band Patient awake    Reviewed: Allergy & Precautions, NPO status , Patient's Chart, lab work & pertinent test results  History of Anesthesia Complications (+) PONV  Airway Mallampati: II  TM Distance: >3 FB Neck ROM: Limited    Dental  (+) Teeth Intact   Pulmonary PE   Pulmonary exam normal        Cardiovascular hypertension, Pt. on medications and Pt. on home beta blockers + Peripheral Vascular Disease   Rhythm:Regular Rate:Normal     Neuro/Psych  Headaches, Anxiety Depression CVA, Residual Symptoms    GI/Hepatic Neg liver ROS, GERD  Controlled,  Endo/Other  negative endocrine ROS  Renal/GU negative Renal ROS  negative genitourinary   Musculoskeletal  (+) Arthritis , Osteoarthritis,    Abdominal (+)  Abdomen: soft.    Peds  Hematology negative hematology ROS (+)   Anesthesia Other Findings   Reproductive/Obstetrics                           Anesthesia Physical Anesthesia Plan  ASA: 3  Anesthesia Plan: General   Post-op Pain Management:    Induction: Intravenous  PONV Risk Score and Plan: 4 or greater and Ondansetron, Dexamethasone and Treatment may vary due to age or medical condition  Airway Management Planned: Mask and Oral ETT  Additional Equipment: None  Intra-op Plan:   Post-operative Plan: Extubation in OR  Informed Consent: I have reviewed the patients History and Physical, chart, labs and discussed the procedure including the risks, benefits and alternatives for the proposed anesthesia with the patient or authorized representative who has indicated his/her understanding and acceptance.     Dental advisory given  Plan Discussed with: CRNA  Anesthesia Plan Comments: (Lab Results      Component                Value               Date                      WBC                      5.9                  03/23/2021                HGB                      13.3                03/23/2021                HCT                      40.8                03/23/2021                MCV                      96.9                03/23/2021                PLT  194                 03/23/2021           Lab Results      Component                Value               Date                      NA                       138                 03/23/2021                K                        3.9                 03/23/2021                CO2                      24                  03/23/2021                GLUCOSE                  66 (L)              03/23/2021                BUN                      15                  03/23/2021                CREATININE               1.01 (H)            03/23/2021                CALCIUM                  8.7 (L)             03/23/2021                GFRNONAA                 >60                 03/23/2021                GFRAA                    >60                 06/19/2017           PAT note by Antionette Poles, PA-C: History of PE, 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  Patient reported last dose Coumadin 03/21/2021.  Longstanding history of abnormal EKG with lateral T wave changes.  EKG done in PAT 03/23/2021 appears similar.  Echo 2020 at [facility] showed EF 65 to  70%, mild MR.  Preop labs reviewed, unremarkable.  TTE 06/17/2019 (Care Everywhere): SUMMARY  The left ventricular size is normal.  Mild left ventricular hypertrophy   Left ventricular systolic function is normal.  LV ejection fraction = 65-70%.   Left ventricular filling pattern is prolonged relaxation.  The left ventricular wall motion is normal.  The right ventricular systolic function is normal.  There is mild mitral regurgitation.  The aortic sinus is normal size.  IVC size was normal.  There is no pericardial effusion.  There is no comparison study available.   )       Anesthesia  Quick Evaluation

## 2021-03-27 ENCOUNTER — Encounter (HOSPITAL_COMMUNITY): Payer: Self-pay | Admitting: Neurosurgery

## 2021-03-27 ENCOUNTER — Inpatient Hospital Stay (HOSPITAL_COMMUNITY): Payer: Medicare HMO | Admitting: Anesthesiology

## 2021-03-27 ENCOUNTER — Inpatient Hospital Stay (HOSPITAL_COMMUNITY)
Admission: RE | Admit: 2021-03-27 | Discharge: 2021-03-29 | DRG: 454 | Disposition: A | Discharge status: Home / Self Care | Payer: Medicare HMO | Attending: Neurosurgery | Admitting: Neurosurgery

## 2021-03-27 ENCOUNTER — Inpatient Hospital Stay (HOSPITAL_COMMUNITY): Payer: Medicare HMO

## 2021-03-27 ENCOUNTER — Encounter (HOSPITAL_COMMUNITY)
Admission: RE | Disposition: A | Discharge status: Home / Self Care | Payer: Self-pay | Source: Home / Self Care | Attending: Neurosurgery

## 2021-03-27 ENCOUNTER — Inpatient Hospital Stay (HOSPITAL_COMMUNITY): Admission: RE | Admit: 2021-03-27 | Payer: Medicare HMO | Source: Ambulatory Visit | Admitting: Neurosurgery

## 2021-03-27 ENCOUNTER — Inpatient Hospital Stay (HOSPITAL_COMMUNITY): Payer: Medicare HMO | Admitting: Physician Assistant

## 2021-03-27 ENCOUNTER — Other Ambulatory Visit: Payer: Self-pay

## 2021-03-27 DIAGNOSIS — Z79899 Other long term (current) drug therapy: Secondary | ICD-10-CM | POA: Diagnosis not present

## 2021-03-27 DIAGNOSIS — Z90710 Acquired absence of both cervix and uterus: Secondary | ICD-10-CM

## 2021-03-27 DIAGNOSIS — I69354 Hemiplegia and hemiparesis following cerebral infarction affecting left non-dominant side: Secondary | ICD-10-CM

## 2021-03-27 DIAGNOSIS — F419 Anxiety disorder, unspecified: Secondary | ICD-10-CM | POA: Diagnosis present

## 2021-03-27 DIAGNOSIS — Z888 Allergy status to other drugs, medicaments and biological substances status: Secondary | ICD-10-CM

## 2021-03-27 DIAGNOSIS — Z87442 Personal history of urinary calculi: Secondary | ICD-10-CM | POA: Diagnosis not present

## 2021-03-27 DIAGNOSIS — Z86711 Personal history of pulmonary embolism: Secondary | ICD-10-CM | POA: Diagnosis not present

## 2021-03-27 DIAGNOSIS — M1990 Unspecified osteoarthritis, unspecified site: Secondary | ICD-10-CM | POA: Diagnosis present

## 2021-03-27 DIAGNOSIS — Z880 Allergy status to penicillin: Secondary | ICD-10-CM | POA: Diagnosis not present

## 2021-03-27 DIAGNOSIS — M4313 Spondylolisthesis, cervicothoracic region: Secondary | ICD-10-CM | POA: Diagnosis present

## 2021-03-27 DIAGNOSIS — Z882 Allergy status to sulfonamides status: Secondary | ICD-10-CM | POA: Diagnosis not present

## 2021-03-27 DIAGNOSIS — M4803 Spinal stenosis, cervicothoracic region: Secondary | ICD-10-CM | POA: Diagnosis present

## 2021-03-27 DIAGNOSIS — F32A Depression, unspecified: Secondary | ICD-10-CM | POA: Diagnosis present

## 2021-03-27 DIAGNOSIS — I739 Peripheral vascular disease, unspecified: Secondary | ICD-10-CM | POA: Diagnosis present

## 2021-03-27 DIAGNOSIS — I10 Essential (primary) hypertension: Secondary | ICD-10-CM | POA: Diagnosis present

## 2021-03-27 DIAGNOSIS — Z7901 Long term (current) use of anticoagulants: Secondary | ICD-10-CM | POA: Diagnosis not present

## 2021-03-27 DIAGNOSIS — M545 Low back pain, unspecified: Secondary | ICD-10-CM | POA: Diagnosis present

## 2021-03-27 DIAGNOSIS — Z8249 Family history of ischemic heart disease and other diseases of the circulatory system: Secondary | ICD-10-CM

## 2021-03-27 DIAGNOSIS — K219 Gastro-esophageal reflux disease without esophagitis: Secondary | ICD-10-CM | POA: Diagnosis present

## 2021-03-27 DIAGNOSIS — M542 Cervicalgia: Secondary | ICD-10-CM | POA: Diagnosis present

## 2021-03-27 DIAGNOSIS — Z981 Arthrodesis status: Secondary | ICD-10-CM

## 2021-03-27 DIAGNOSIS — Z91048 Other nonmedicinal substance allergy status: Secondary | ICD-10-CM

## 2021-03-27 DIAGNOSIS — Z419 Encounter for procedure for purposes other than remedying health state, unspecified: Secondary | ICD-10-CM

## 2021-03-27 DIAGNOSIS — M4802 Spinal stenosis, cervical region: Secondary | ICD-10-CM | POA: Diagnosis present

## 2021-03-27 HISTORY — PX: POSTERIOR CERVICAL FUSION/FORAMINOTOMY: SHX5038

## 2021-03-27 LAB — PROTIME-INR
INR: 1.3 — ABNORMAL HIGH (ref 0.8–1.2)
Prothrombin Time: 16.1 seconds — ABNORMAL HIGH (ref 11.4–15.2)

## 2021-03-27 SURGERY — POSTERIOR CERVICAL FUSION/FORAMINOTOMY LEVEL 1
Anesthesia: General

## 2021-03-27 MED ORDER — MECLIZINE HCL 25 MG PO TABS: 25.0000 mg | ORAL_TABLET | Freq: Four times a day (QID) | ORAL | Status: DC | PRN

## 2021-03-27 MED ORDER — ORAL CARE MOUTH RINSE: 15.0000 mL | Freq: Once | OROMUCOSAL | Status: AC

## 2021-03-27 MED ORDER — TRIAMCINOLONE ACETONIDE 0.1 % EX OINT
1.0000 "application " | TOPICAL_OINTMENT | Freq: Two times a day (BID) | CUTANEOUS | Status: DC
  Administered 2021-03-28: 1 via TOPICAL
  Filled 2021-03-27: qty 15

## 2021-03-27 MED ORDER — LACTATED RINGERS IV SOLN: INTRAVENOUS | Status: DC

## 2021-03-27 MED ORDER — ROCURONIUM BROMIDE 100 MG/10ML IV SOLN
INTRAVENOUS | Status: DC | PRN
  Administered 2021-03-27: 60 mg via INTRAVENOUS
  Administered 2021-03-27: 10 mg via INTRAVENOUS
  Administered 2021-03-27 (×2): 20 mg via INTRAVENOUS

## 2021-03-27 MED ORDER — MIDAZOLAM HCL 2 MG/2ML IJ SOLN
INTRAMUSCULAR | Status: DC | PRN
  Administered 2021-03-27: 2 mg via INTRAVENOUS

## 2021-03-27 MED ORDER — CHLORHEXIDINE GLUCONATE 0.12 % MT SOLN: 15.0000 mL | Freq: Once | OROMUCOSAL | Status: AC

## 2021-03-27 MED ORDER — ATORVASTATIN CALCIUM 10 MG PO TABS: 20.0000 mg | ORAL_TABLET | ORAL | Status: DC

## 2021-03-27 MED ORDER — LIDOCAINE HCL (CARDIAC) PF 100 MG/5ML IV SOSY
PREFILLED_SYRINGE | INTRAVENOUS | Status: DC | PRN
  Administered 2021-03-27: 60 mg via INTRAVENOUS

## 2021-03-27 MED ORDER — ESMOLOL HCL 100 MG/10ML IV SOLN
INTRAVENOUS | Status: DC | PRN
  Administered 2021-03-27: 40 mg via INTRAVENOUS

## 2021-03-27 MED ORDER — DM-APAP-CPM 10-325-2 MG PO TABS: ORAL_TABLET | Freq: Every day | ORAL | Status: DC | PRN

## 2021-03-27 MED ORDER — ONDANSETRON HCL 4 MG/2ML IJ SOLN
INTRAMUSCULAR | Status: DC | PRN
  Administered 2021-03-27: 4 mg via INTRAVENOUS

## 2021-03-27 MED ORDER — AMISULPRIDE (ANTIEMETIC) 5 MG/2ML IV SOLN: 10.0000 mg | Freq: Once | INTRAVENOUS | Status: DC | PRN

## 2021-03-27 MED ORDER — CITALOPRAM HYDROBROMIDE 20 MG PO TABS
40.0000 mg | ORAL_TABLET | Freq: Every day | ORAL | Status: DC
  Administered 2021-03-29: 40 mg via ORAL
  Filled 2021-03-27 (×2): qty 2

## 2021-03-27 MED ORDER — PHENOL 1.4 % MT LIQD: 1.0000 | OROMUCOSAL | Status: DC | PRN

## 2021-03-27 MED ORDER — ONDANSETRON HCL 4 MG/2ML IJ SOLN: 4.0000 mg | Freq: Four times a day (QID) | INTRAMUSCULAR | Status: DC | PRN

## 2021-03-27 MED ORDER — DEXAMETHASONE SODIUM PHOSPHATE 10 MG/ML IJ SOLN
INTRAMUSCULAR | Status: DC | PRN
  Administered 2021-03-27: 5 mg via INTRAVENOUS

## 2021-03-27 MED ORDER — SUGAMMADEX SODIUM 500 MG/5ML IV SOLN
INTRAVENOUS | Status: DC | PRN
  Administered 2021-03-27: 225 mg via INTRAVENOUS

## 2021-03-27 MED ORDER — THROMBIN 20000 UNITS EX SOLR
CUTANEOUS | Status: AC
  Filled 2021-03-27: qty 20000

## 2021-03-27 MED ORDER — LIDOCAINE-EPINEPHRINE 1 %-1:100000 IJ SOLN
INTRAMUSCULAR | Status: DC | PRN
  Administered 2021-03-27: 10 mL

## 2021-03-27 MED ORDER — PROPOFOL 10 MG/ML IV BOLUS
INTRAVENOUS | Status: AC
  Filled 2021-03-27: qty 20

## 2021-03-27 MED ORDER — LIDOCAINE-EPINEPHRINE 1 %-1:100000 IJ SOLN
INTRAMUSCULAR | Status: AC
  Filled 2021-03-27: qty 1

## 2021-03-27 MED ORDER — BACITRACIN ZINC 500 UNIT/GM EX OINT
TOPICAL_OINTMENT | CUTANEOUS | Status: DC | PRN
  Administered 2021-03-27: 1 via TOPICAL

## 2021-03-27 MED ORDER — PROPOFOL 10 MG/ML IV BOLUS
INTRAVENOUS | Status: DC | PRN
  Administered 2021-03-27: 50 mg via INTRAVENOUS
  Administered 2021-03-27: 160 mg via INTRAVENOUS

## 2021-03-27 MED ORDER — ONDANSETRON HCL 4 MG PO TABS: 4.0000 mg | ORAL_TABLET | Freq: Four times a day (QID) | ORAL | Status: DC | PRN

## 2021-03-27 MED ORDER — ROCURONIUM BROMIDE 10 MG/ML (PF) SYRINGE
PREFILLED_SYRINGE | INTRAVENOUS | Status: AC
  Filled 2021-03-27: qty 10

## 2021-03-27 MED ORDER — MENTHOL 3 MG MT LOZG: 1.0000 | LOZENGE | OROMUCOSAL | Status: DC | PRN

## 2021-03-27 MED ORDER — ALUM & MAG HYDROXIDE-SIMETH 200-200-20 MG/5ML PO SUSP: 30.0000 mL | Freq: Four times a day (QID) | ORAL | Status: DC | PRN

## 2021-03-27 MED ORDER — PHENYLEPHRINE HCL (PRESSORS) 10 MG/ML IV SOLN
INTRAVENOUS | Status: AC
  Filled 2021-03-27: qty 2

## 2021-03-27 MED ORDER — VANCOMYCIN HCL 1250 MG/250ML IV SOLN
1250.0000 mg | Freq: Once | INTRAVENOUS | Status: AC
  Administered 2021-03-27: 1250 mg via INTRAVENOUS
  Filled 2021-03-27: qty 250

## 2021-03-27 MED ORDER — SODIUM CHLORIDE 0.9 % IV SOLN
250.0000 mL | INTRAVENOUS | Status: DC
  Administered 2021-03-27: 250 mL via INTRAVENOUS

## 2021-03-27 MED ORDER — LABETALOL HCL 5 MG/ML IV SOLN
INTRAVENOUS | Status: DC | PRN
  Administered 2021-03-27 (×2): 5 mg via INTRAVENOUS

## 2021-03-27 MED ORDER — ACETAMINOPHEN 10 MG/ML IV SOLN
INTRAVENOUS | Status: AC
  Filled 2021-03-27: qty 100

## 2021-03-27 MED ORDER — DEXAMETHASONE SODIUM PHOSPHATE 10 MG/ML IJ SOLN
INTRAMUSCULAR | Status: AC
  Filled 2021-03-27: qty 1

## 2021-03-27 MED ORDER — CHLORHEXIDINE GLUCONATE 0.12 % MT SOLN
OROMUCOSAL | Status: AC
  Administered 2021-03-27: 15 mL via OROMUCOSAL
  Filled 2021-03-27: qty 15

## 2021-03-27 MED ORDER — PHENYLEPHRINE HCL (PRESSORS) 10 MG/ML IV SOLN
INTRAVENOUS | Status: DC | PRN
  Administered 2021-03-27 (×2): 80 ug via INTRAVENOUS

## 2021-03-27 MED ORDER — SENNOSIDES-DOCUSATE SODIUM 8.6-50 MG PO TABS
1.0000 | ORAL_TABLET | Freq: Every day | ORAL | Status: DC
  Administered 2021-03-27 – 2021-03-28 (×2): 1 via ORAL
  Filled 2021-03-27 (×2): qty 1

## 2021-03-27 MED ORDER — PHENYLEPHRINE HCL-NACL 20-0.9 MG/250ML-% IV SOLN
INTRAVENOUS | Status: DC | PRN
  Administered 2021-03-27: 50 ug/min via INTRAVENOUS

## 2021-03-27 MED ORDER — ACETAMINOPHEN 325 MG PO TABS: 650.0000 mg | ORAL_TABLET | ORAL | Status: DC | PRN

## 2021-03-27 MED ORDER — EPHEDRINE SULFATE 50 MG/ML IJ SOLN
INTRAMUSCULAR | Status: DC | PRN
  Administered 2021-03-27 (×3): 5 mg via INTRAVENOUS
  Administered 2021-03-27: 10 mg via INTRAVENOUS

## 2021-03-27 MED ORDER — OXYCODONE HCL 5 MG PO TABS
10.0000 mg | ORAL_TABLET | ORAL | Status: DC | PRN
  Administered 2021-03-27 – 2021-03-29 (×9): 10 mg via ORAL
  Filled 2021-03-27 (×9): qty 2

## 2021-03-27 MED ORDER — BUPIVACAINE HCL (PF) 0.25 % IJ SOLN
INTRAMUSCULAR | Status: DC | PRN
  Administered 2021-03-27: 10 mL

## 2021-03-27 MED ORDER — FENTANYL CITRATE (PF) 100 MCG/2ML IJ SOLN
25.0000 ug | INTRAMUSCULAR | Status: DC | PRN
  Administered 2021-03-27: 50 ug via INTRAVENOUS

## 2021-03-27 MED ORDER — MIDAZOLAM HCL 2 MG/2ML IJ SOLN
INTRAMUSCULAR | Status: AC
  Filled 2021-03-27: qty 2

## 2021-03-27 MED ORDER — ACETAMINOPHEN 10 MG/ML IV SOLN: 1000.0000 mg | Freq: Once | INTRAVENOUS | Status: DC | PRN

## 2021-03-27 MED ORDER — THROMBIN 20000 UNITS EX SOLR: CUTANEOUS | Status: DC | PRN

## 2021-03-27 MED ORDER — VANCOMYCIN HCL 1500 MG/300ML IV SOLN
1500.0000 mg | INTRAVENOUS | Status: AC
  Administered 2021-03-27: 1500 mg via INTRAVENOUS
  Filled 2021-03-27: qty 300

## 2021-03-27 MED ORDER — ALLOPURINOL 100 MG PO TABS
100.0000 mg | ORAL_TABLET | Freq: Every day | ORAL | Status: DC
  Administered 2021-03-29: 100 mg via ORAL
  Filled 2021-03-27 (×2): qty 1

## 2021-03-27 MED ORDER — CYCLOBENZAPRINE HCL 10 MG PO TABS
10.0000 mg | ORAL_TABLET | Freq: Three times a day (TID) | ORAL | Status: DC | PRN
  Administered 2021-03-27 – 2021-03-28 (×3): 10 mg via ORAL
  Filled 2021-03-27 (×3): qty 1

## 2021-03-27 MED ORDER — CHLORHEXIDINE GLUCONATE CLOTH 2 % EX PADS
6.0000 | MEDICATED_PAD | Freq: Every day | CUTANEOUS | Status: DC
  Administered 2021-03-29: 6 via TOPICAL

## 2021-03-27 MED ORDER — ONDANSETRON HCL 4 MG/2ML IJ SOLN
INTRAMUSCULAR | Status: AC
  Filled 2021-03-27: qty 2

## 2021-03-27 MED ORDER — DIPHENHYDRAMINE HCL 50 MG/ML IJ SOLN
INTRAMUSCULAR | Status: DC | PRN
  Administered 2021-03-27: 25 mg via INTRAVENOUS

## 2021-03-27 MED ORDER — MUPIROCIN 2 % EX OINT
1.0000 "application " | TOPICAL_OINTMENT | Freq: Two times a day (BID) | CUTANEOUS | Status: DC
  Administered 2021-03-27 – 2021-03-28 (×3): 1 via NASAL
  Filled 2021-03-27: qty 22

## 2021-03-27 MED ORDER — CARVEDILOL 12.5 MG PO TABS
12.5000 mg | ORAL_TABLET | Freq: Two times a day (BID) | ORAL | Status: DC
  Administered 2021-03-28 – 2021-03-29 (×3): 12.5 mg via ORAL
  Filled 2021-03-27 (×3): qty 1

## 2021-03-27 MED ORDER — EPHEDRINE 5 MG/ML INJ
INTRAVENOUS | Status: AC
  Filled 2021-03-27: qty 10

## 2021-03-27 MED ORDER — TETRAHYDROZOLINE HCL 0.05 % OP SOLN: 2.0000 [drp] | Freq: Every day | OPHTHALMIC | Status: DC | PRN

## 2021-03-27 MED ORDER — ALBUMIN HUMAN 5 % IV SOLN: INTRAVENOUS | Status: DC | PRN

## 2021-03-27 MED ORDER — 0.9 % SODIUM CHLORIDE (POUR BTL) OPTIME
TOPICAL | Status: DC | PRN
  Administered 2021-03-27: 1000 mL

## 2021-03-27 MED ORDER — FENTANYL CITRATE (PF) 100 MCG/2ML IJ SOLN
INTRAMUSCULAR | Status: AC
  Filled 2021-03-27: qty 2

## 2021-03-27 MED ORDER — ACETAMINOPHEN 10 MG/ML IV SOLN
INTRAVENOUS | Status: DC | PRN
  Administered 2021-03-27: 1000 mg via INTRAVENOUS

## 2021-03-27 MED ORDER — LIDOCAINE 2% (20 MG/ML) 5 ML SYRINGE
INTRAMUSCULAR | Status: AC
  Filled 2021-03-27: qty 5

## 2021-03-27 MED ORDER — GLYCOPYRROLATE 0.2 MG/ML IJ SOLN
INTRAMUSCULAR | Status: DC | PRN
  Administered 2021-03-27 (×2): .1 mg via INTRAVENOUS

## 2021-03-27 MED ORDER — BUPIVACAINE HCL (PF) 0.25 % IJ SOLN
INTRAMUSCULAR | Status: AC
  Filled 2021-03-27: qty 30

## 2021-03-27 MED ORDER — FENTANYL CITRATE (PF) 250 MCG/5ML IJ SOLN
INTRAMUSCULAR | Status: AC
  Filled 2021-03-27: qty 5

## 2021-03-27 MED ORDER — CEFAZOLIN SODIUM-DEXTROSE 2-4 GM/100ML-% IV SOLN
2.0000 g | Freq: Three times a day (TID) | INTRAVENOUS | Status: DC
Start: 2021-03-27 — End: 2021-03-27

## 2021-03-27 MED ORDER — PANTOPRAZOLE SODIUM 40 MG IV SOLR
40.0000 mg | Freq: Every day | INTRAVENOUS | Status: DC
  Administered 2021-03-27: 40 mg via INTRAVENOUS
  Filled 2021-03-27: qty 40

## 2021-03-27 MED ORDER — ESTRADIOL 1 MG PO TABS
0.5000 mg | ORAL_TABLET | Freq: Every day | ORAL | Status: DC
  Administered 2021-03-29: 0.5 mg via ORAL
  Filled 2021-03-27 (×2): qty 0.5

## 2021-03-27 MED ORDER — FENTANYL CITRATE (PF) 250 MCG/5ML IJ SOLN
INTRAMUSCULAR | Status: DC | PRN
  Administered 2021-03-27 (×5): 50 ug via INTRAVENOUS

## 2021-03-27 MED ORDER — SODIUM CHLORIDE 0.9% FLUSH: 3.0000 mL | INTRAVENOUS | Status: DC | PRN

## 2021-03-27 MED ORDER — HYDROMORPHONE HCL 1 MG/ML IJ SOLN: 0.5000 mg | INTRAMUSCULAR | Status: DC | PRN

## 2021-03-27 MED ORDER — SODIUM CHLORIDE 0.9% FLUSH: 3.0000 mL | Freq: Two times a day (BID) | INTRAVENOUS | Status: DC

## 2021-03-27 MED ORDER — ACETAMINOPHEN 500 MG PO TABS: 1000.0000 mg | ORAL_TABLET | Freq: Two times a day (BID) | ORAL | Status: DC | PRN

## 2021-03-27 MED ORDER — ACETAMINOPHEN 650 MG RE SUPP: 650.0000 mg | RECTAL | Status: DC | PRN

## 2021-03-27 SURGICAL SUPPLY — 61 items
BAG COUNTER SPONGE SURGICOUNT (BAG) ×2 IMPLANT
BAND RUBBER #18 3X1/16 STRL (MISCELLANEOUS) ×8 IMPLANT
BENZOIN TINCTURE PRP APPL 2/3 (GAUZE/BANDAGES/DRESSINGS) ×2 IMPLANT
BIT DRILL 3.5 SHORT (BIT) ×2 IMPLANT
BIT DRILL NEURO 2X3.1 SFT TUCH (MISCELLANEOUS) ×1 IMPLANT
BLADE CLIPPER SURG (BLADE) ×2 IMPLANT
BLADE SURG 11 STRL SS (BLADE) ×2 IMPLANT
BONE VIVIGEN FORMABLE 5.4CC (Bone Implant) ×2 IMPLANT
BUR MATCHSTICK NEURO 3.0 LAGG (BURR) ×2 IMPLANT
CANISTER SUCT 3000ML PPV (MISCELLANEOUS) ×2 IMPLANT
CAP LOCKING (Cap) ×2 IMPLANT
CARTRIDGE OIL MAESTRO DRILL (MISCELLANEOUS) ×1 IMPLANT
DERMABOND ADVANCED (GAUZE/BANDAGES/DRESSINGS) ×1
DERMABOND ADVANCED .7 DNX12 (GAUZE/BANDAGES/DRESSINGS) ×1 IMPLANT
DIFFUSER DRILL AIR PNEUMATIC (MISCELLANEOUS) ×2 IMPLANT
DRAPE C-ARM 42X72 X-RAY (DRAPES) ×4 IMPLANT
DRAPE LAPAROTOMY 100X72 PEDS (DRAPES) ×2 IMPLANT
DRAPE MICROSCOPE LEICA (MISCELLANEOUS) ×2 IMPLANT
DRAPE SURG 17X23 STRL (DRAPES) ×8 IMPLANT
DRILL NEURO 2X3.1 SOFT TOUCH (MISCELLANEOUS) ×2
DRSG OPSITE 4X5.5 SM (GAUZE/BANDAGES/DRESSINGS) ×2 IMPLANT
DRSG OPSITE POSTOP 4X6 (GAUZE/BANDAGES/DRESSINGS) ×4 IMPLANT
DURAPREP 26ML APPLICATOR (WOUND CARE) ×2 IMPLANT
ELECT REM PT RETURN 9FT ADLT (ELECTROSURGICAL) ×2
ELECTRODE REM PT RTRN 9FT ADLT (ELECTROSURGICAL) ×1 IMPLANT
EVACUATOR 1/8 PVC DRAIN (DRAIN) ×2 IMPLANT
GAUZE 4X4 16PLY ~~LOC~~+RFID DBL (SPONGE) ×2 IMPLANT
GLOVE SURG ENC MOIS LTX SZ7 (GLOVE) ×4 IMPLANT
GLOVE SURG ENC MOIS LTX SZ8 (GLOVE) ×6 IMPLANT
GLOVE SURG UNDER LTX SZ8.5 (GLOVE) ×6 IMPLANT
GLOVE SURG UNDER POLY LF SZ7 (GLOVE) ×4 IMPLANT
GOWN STRL REUS W/ TWL LRG LVL3 (GOWN DISPOSABLE) ×1 IMPLANT
GOWN STRL REUS W/ TWL XL LVL3 (GOWN DISPOSABLE) ×3 IMPLANT
GOWN STRL REUS W/TWL 2XL LVL3 (GOWN DISPOSABLE) IMPLANT
GOWN STRL REUS W/TWL LRG LVL3 (GOWN DISPOSABLE) ×1
GOWN STRL REUS W/TWL XL LVL3 (GOWN DISPOSABLE) ×3
KIT BASIN OR (CUSTOM PROCEDURE TRAY) ×2 IMPLANT
KIT INFUSE X SMALL 1.4CC (Orthopedic Implant) ×2 IMPLANT
KIT TURNOVER KIT B (KITS) ×2 IMPLANT
LOCKING CAP (Cap) ×4 IMPLANT
MARKER SKIN DUAL TIP RULER LAB (MISCELLANEOUS) ×2 IMPLANT
NEEDLE HYPO 25X1 1.5 SAFETY (NEEDLE) ×2 IMPLANT
NEEDLE SPNL 20GX3.5 QUINCKE YW (NEEDLE) ×2 IMPLANT
NS IRRIG 1000ML POUR BTL (IV SOLUTION) ×4 IMPLANT
OIL CARTRIDGE MAESTRO DRILL (MISCELLANEOUS) ×2
PACK LAMINECTOMY NEURO (CUSTOM PROCEDURE TRAY) ×2 IMPLANT
PIN MAYFIELD SKULL DISP (PIN) ×2 IMPLANT
ROD CVD QUARTEX 3.5X35 (Rod) ×2 IMPLANT
SCREW 4.0X18MM (Screw) ×2 IMPLANT
SCREW QUARTEX 4.0X16MM POLY (Screw) ×2 IMPLANT
SPONGE SURGIFOAM ABS GEL 100 (HEMOSTASIS) ×2 IMPLANT
SPONGE T-LAP 4X18 ~~LOC~~+RFID (SPONGE) ×2 IMPLANT
STRIP CLOSURE SKIN 1/2X4 (GAUZE/BANDAGES/DRESSINGS) ×4 IMPLANT
SUT VIC AB 0 CT1 18XCR BRD8 (SUTURE) ×1 IMPLANT
SUT VIC AB 0 CT1 8-18 (SUTURE) ×1
SUT VIC AB 2-0 CT1 18 (SUTURE) ×2 IMPLANT
SUT VIC AB 4-0 PS2 27 (SUTURE) ×2 IMPLANT
TOWEL GREEN STERILE (TOWEL DISPOSABLE) ×2 IMPLANT
TOWEL GREEN STERILE FF (TOWEL DISPOSABLE) ×2 IMPLANT
TRAY FOLEY MTR SLVR 16FR STAT (SET/KITS/TRAYS/PACK) IMPLANT
WATER STERILE IRR 1000ML POUR (IV SOLUTION) ×2 IMPLANT

## 2021-03-27 NOTE — Op Note (Signed)
Preoperative diagnosis: Grade 1 spondylolisthesis C7-T1 with instability and neck pain with spinal stenosis and foraminal stenosis at C7-T1  Postoperative diagnosis: Same  Procedure: #1 right-sided foraminotomies of C7 and T1 for decompression and placement of right-sided C7 and T1 pedicle screws with redo foraminotomy attempted on the left at C8  2.  Placement of right-sided C7-T1 pedicle screws utilizing the globus ellipse lateral mass and pedicle screw system  3.  Bilateral posterolateral arthrodesis C7-T1 utilizing locally harvested autograft mixed with vivigen and BMP  Surgeon: Jillyn Hidden Charlet Harr  Assistant: Julien Girt  Anesthesia: General  EBL: Minimal  HPI: 70 year old female with persistent neck pain previously undergone a left-sided foraminotomies at C7-C8 and lateral mass screw placement however the hardware loosened and subsequently had the hardware removed on that side however did have some posterior lateral bone that remained and went on to coalesce and fuse.  However she has had persistent neck pain primarily right-sided that its been referable to C7-T1 diagnosed with intermittent injections she is failed physical therapy limited response with injections anti-inflammatories pain management and time so I recommended redo fusion at C7-T1.  I explained to her the risks and benefits as well as perioperative course expectations of outcome and alternatives of surgery and she understood and agreed to proceed forward.  Operative procedure: Patient brought into the OR was [place] general anesthesia positioned prone in pins the backside of her neck was prepped and draped in routine sterile fashion roll incision was opened up and extended slightly cephalad caudally initially subperiosteal dissection carried on the right exposing the C6-C7 and T1 lamina complex then worked through the scar tissue I reexposed the laminotomy site aminal and lateral mass and TP complex on the left.  Due to patient's body  habitus and the location it was very difficult to get good fluoroscopic images AP was able to identify where the margins of the pedicle were somewhat distinctly.  However was not able to visualize anything on lateral so I did elect to go ahead and perform the decompressive laminotomy and foraminotomies of C7 and C8 on the right to decompress the nerve roots but also facilitate direct visualization and placement of pedicle screws.  On the left side was attempted to perform the same identify the scar tissue dissected off what I thought was probably the pedicle of T1 although I could not clearly identify anatomic landmarks due to extensive mount of scar as well as there was an extensive mount of bony overgrowth that look like it did at least partially fused the patient's left-sided C7-T1 and I did not pick up an overt pseudoarthrosis at that level.  So due to the difficult visualization with x-ray and the inability to get good anatomic landmarks directly visualized on the left the redo side I elected not to put pedicle screws on the left and just do a posterior lateral arthrodesis on that side and placed the pedicle screws only on the right.  So under fluoroscopy I placed bilateral C7 and T1 pedicle screws on the right then I aggressively decorticated the lateral masses and lamina complexes at C7-T1 bilaterally and laid the autograft mixed along with BMP posterior laterally bilaterally.  Reinspected my foramen to confirm patency of the C7 and C8 foraminotomies but Gelfoam in the dura attached the rod anchored the knots in place to placed a medium Hemovac drain injected Marcaine in the fascia and closed wound in layers with Vicryl and a running 4 subcuticular.  Dermabond benzoin Steri-Strips and a sterile dressing was  applied patient recovery in stable condition.  At the end the case all needle count sponge counts were correct.

## 2021-03-27 NOTE — Anesthesia Procedure Notes (Signed)
Procedure Name: Intubation Date/Time: 03/27/2021 2:04 PM Performed by: Zollie Scale, CRNA Pre-anesthesia Checklist: Patient identified, Emergency Drugs available, Suction available and Patient being monitored Patient Re-evaluated:Patient Re-evaluated prior to induction Oxygen Delivery Method: Circle System Utilized Preoxygenation: Pre-oxygenation with 100% oxygen Induction Type: IV induction Ventilation: Mask ventilation without difficulty and Oral airway inserted - appropriate to patient size Laryngoscope Size: Glidescope and 4 Grade View: Grade I Tube type: Oral Tube size: 7.0 mm Number of attempts: 1 Airway Equipment and Method: Stylet and Oral airway Placement Confirmation: ETT inserted through vocal cords under direct vision, positive ETCO2 and breath sounds checked- equal and bilateral Secured at: 21 cm Tube secured with: Tape Dental Injury: Teeth and Oropharynx as per pre-operative assessment  Comments: Glidescope utilized d/t neck immobility. Patient voiced comfort with neck position prior to induction.

## 2021-03-27 NOTE — H&P (Signed)
Sierra Leach is an 70 y.o. female.   Chief Complaint: Neck pain HPI: 70 year old female with severe neck pain refractory to all forms of conservative treatment work-up revealed spondylolisthesis at C7-T1 progressive due to her progressive clinical syndrome imaging findings and failed conservative treatment I recommended posterior cervical fusion C7-T1 with probable foraminotomies for screw placement with pedicle screws placed at C7 and T1 bilaterally.  I have extensively gone over the risks and benefits of the operation with her as well as perioperative course expectations of outcome and alternatives of surgery and she understands and agrees to proceed forward.  Past Medical History:  Diagnosis Date   Anxiety    Arthritis    Depression    GERD (gastroesophageal reflux disease)    occ   Headache    used to have migraines   History of kidney stones    Hypertension    Lumbar back pain    Peripheral vascular disease (HCC) 2013   pulmonary embolus   Pneumonia    PONV (postoperative nausea and vomiting)    Stroke (HCC) 99   left leg weakness    Past Surgical History:  Procedure Laterality Date   ABDOMINAL HYSTERECTOMY     ANTERIOR CERVICAL DECOMP/DISCECTOMY FUSION N/A 02/06/2016   Procedure: ANTERIOR CERVICAL DECOMPRESSION FUSION CERVICAL THREE-FOUR WITH REMOVAL OF PLATE CERVICAL FOUR-SEVEN  ;  Surgeon: Donalee Citrin, MD;  Location: [facility];  Service: Neurosurgery;  Laterality: N/A;   BACK SURGERY     x8   bone spurs     multiple   BREAST SURGERY Left    lumpectomy x3   COLONOSCOPY     LUMBAR WOUND DEBRIDEMENT N/A 09/04/2013   Procedure: irrigation and debridement of lumbar wound;  Surgeon: Gwynne Edinger, MD;  Location: [facility];  Service: Neurosurgery;  Laterality: N/A;  irrigation and debridement of lumbar wound   POSTERIOR CERVICAL FUSION/FORAMINOTOMY Left 12/19/2016   Procedure: Posterior Cervical Fusion with lateral mass fixation - Cervical Seven-Thoracic one with Diskectomy  left;  Surgeon: Donalee Citrin, MD;  Location: [facility] OR;  Service: Neurosurgery;  Laterality: Left;   POSTERIOR CERVICAL FUSION/FORAMINOTOMY N/A 05/15/2017   Procedure: Posterior Cervical Fusion with lateral mass fixation - Cervical seven-Thoracic one revision;  Surgeon: Donalee Citrin, MD;  Location: [facility] OR;  Service: Neurosurgery;  Laterality: N/A;   POSTERIOR CERVICAL FUSION/FORAMINOTOMY N/A 06/19/2017   Procedure: Cervical seven-Thoracic one removal of hardware;  Surgeon: Donalee Citrin, MD;  Location: [facility] OR;  Service: Neurosurgery;  Laterality: N/A;   SHOULDER ARTHROSCOPY Left 03   rotatoer cuff   SHOULDER ARTHROSCOPY Right 99   bone spur    Family History  Problem Relation Age of Onset   High arches Mother    Neuropathy Mother    COPD Father    High blood pressure Sister    Diabetes Brother    Parkinson's disease Brother    Social History:  reports that she has never smoked. She has never used smokeless tobacco. She reports that she does not drink alcohol and does not use drugs.  Allergies:  Allergies  Allergen Reactions   Penicillins Hives, Itching, Rash and Other (See Comments)    Has patient had a PCN reaction causing immediate rash, facial/tongue/throat swelling, SOB or lightheadedness with hypotension: Yes Has patient had a PCN reaction causing severe rash involving mucus membranes or skin necrosis: No Has patient had a PCN reaction that required hospitalization: No Has patient had a PCN reaction occurring within the last 10 years: No  If all of the above answers are "NO", then may proceed with Cephalosporin use.    Sulfa Antibiotics Other (See Comments)    Breaks mouth out in blisters   Talwin [Pentazocine] Other (See Comments)    Hallucinations, "out of body experience" back in her early 36's   Adhesive [Tape] Rash    Medications Prior to Admission  Medication Sig Dispense Refill   acetaminophen (TYLENOL) 500 MG tablet Take 1,500 mg by mouth 2 (two) times daily as needed for mild  pain or headache.     allopurinol (ZYLOPRIM) 100 MG tablet Take 100 mg by mouth daily.     atorvastatin (LIPITOR) 20 MG tablet Take 20 mg by mouth every Sunday.     carvedilol (COREG) 12.5 MG tablet Take 12.5 mg by mouth 2 (two) times daily with a meal.     citalopram (CELEXA) 40 MG tablet Take 40 mg by mouth daily.     DM-APAP-CPM (CORICIDIN HBP PO) Take 1 tablet by mouth daily as needed (allergies).     estradiol (ESTRACE) 0.5 MG tablet Take 0.5 mg by mouth daily.     meclizine (ANTIVERT) 25 MG tablet Take 25 mg by mouth every 6 (six) hours as needed for dizziness.     sennosides-docusate sodium (SENOKOT-S) 8.6-50 MG tablet Take 1 tablet by mouth at bedtime.     Tetrahydrozoline HCl (VISINE OP) Place 1 drop into both eyes daily as needed (allergies).     triamcinolone ointment (KENALOG) 0.1 % Apply 1 application topically 2 (two) times daily.     warfarin (COUMADIN) 3 MG tablet Take 3-6 mg by mouth See admin instructions. Take 3 mg by mouth daily at night except on Mon and Thurs take 6 mg at night      Results for orders placed or performed during the hospital encounter of 03/27/21 (from the past 48 hour(s))  Protime-INR     Status: Abnormal   Collection Time: 03/27/21 12:11 PM  Result Value Ref Range   Prothrombin Time 16.1 (H) 11.4 - 15.2 seconds   INR 1.3 (H) 0.8 - 1.2    Comment: (NOTE) INR goal varies based on device and disease states. Performed at [facility], [address] [place], [place] [address]    No results found.  Review of Systems  Musculoskeletal:  Positive for neck pain.   Blood pressure (!) 177/93, pulse 68, temperature 98.1 F (36.7 C), temperature source Oral, resp. rate 20, height 5\' 4"  (1.626 m), weight 104.3 kg, SpO2 95 %. Physical Exam HENT:     Head: Normocephalic.     Nose: Nose normal.     Mouth/Throat:     Mouth: Mucous membranes are moist.  Eyes:     Pupils: Pupils are equal, round, and reactive to light.  Cardiovascular:     Rate and  Rhythm: Normal rate.  Pulmonary:     Effort: Pulmonary effort is normal.  Abdominal:     General: Abdomen is flat.  Musculoskeletal:        General: Normal range of motion.  Neurological:     General: No focal deficit present.     Mental Status: She is alert.     Comments: Awake and alert strength 5 and 5 deltoid, bicep, tricep, wrist flexion, wrist extension, hand intrinsics.     Assessment/Plan 70 year old presents for posterior cervical fusion C7-T1  69, MD 03/27/2021, 1:04 PM

## 2021-03-27 NOTE — Progress Notes (Signed)
Dr. Nance Pew aware of PT 16.1.

## 2021-03-27 NOTE — Transfer of Care (Signed)
Immediate Anesthesia Transfer of Care Note  Patient: Sierra Leach  Procedure(s) Performed: Posterior cervical fusion with lateral mass fixation - Cervical Seven-Thoracic One  Patient Location: PACU  Anesthesia Type:General  Level of Consciousness: awake, alert  and oriented  Airway & Oxygen Therapy: Patient Spontanous Breathing and Patient connected to nasal cannula oxygen  Post-op Assessment: Report given to RN and Post -op Vital signs reviewed and stable  Post vital signs: Reviewed and stable  Last Vitals:  Vitals Value Taken Time  BP 149/76 03/27/21 1720  Temp 36.7 C 03/27/21 1720  Pulse 76 03/27/21 1726  Resp 17 03/27/21 1726  SpO2 94 % 03/27/21 1726  Vitals shown include unvalidated device data.  Last Pain:  Vitals:   03/27/21 1146  TempSrc:   PainSc: 8          Complications: No notable events documented.

## 2021-03-28 ENCOUNTER — Encounter (HOSPITAL_COMMUNITY): Payer: Self-pay | Admitting: Neurosurgery

## 2021-03-28 MED ORDER — PANTOPRAZOLE SODIUM 40 MG PO TBEC
40.0000 mg | DELAYED_RELEASE_TABLET | Freq: Every day | ORAL | Status: DC
  Administered 2021-03-28: 40 mg via ORAL
  Filled 2021-03-28: qty 1

## 2021-03-28 NOTE — Anesthesia Postprocedure Evaluation (Signed)
Anesthesia Post Note  Patient: Sierra Leach  Procedure(s) Performed: Posterior cervical fusion with lateral mass fixation - Cervical Seven-Thoracic One     Patient location during evaluation: PACU Anesthesia Type: General Level of consciousness: awake and alert Pain management: pain level controlled Vital Signs Assessment: post-procedure vital signs reviewed and stable Respiratory status: spontaneous breathing, nonlabored ventilation, respiratory function stable and patient connected to nasal cannula oxygen Cardiovascular status: blood pressure returned to baseline and stable Postop Assessment: no apparent nausea or vomiting Anesthetic complications: no   No notable events documented.  Last Vitals:  Vitals:   03/28/21 0037 03/28/21 0358  BP: 119/81 (!) 141/91  Pulse: 74 78  Resp: 20 18  Temp: 36.4 C (!) 36.4 C  SpO2: 97% 96%    Last Pain:  Vitals:   03/28/21 0447  TempSrc:   PainSc: Asleep                 Nelle Don [name]

## 2021-03-28 NOTE — Progress Notes (Signed)
Occupational Therapy Evaluation Patient Details Name: Sierra Leach MRN: [id number] DOB: [birth year] Today's Date: 03/28/2021   History of Present Illness 70 year old female s/p C7-T1 fusion. 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   Clinical Impression   PTA, pt was still driving, living alone, and was independent in ADLs and IADLs without an assistive device. Pt is currently requiring supervision for all ADLs and functional mobility; exception of min guard for tub shower transfers. Pt educated on compensatory strategies to adhere to cervical precautions during ADLs and functional mobility. Pt required min verbal cues throughout to recall cervical precautions, but able to self-correct by end of session. Pt to stay with sister after d/c. Due to caregiver support, current level of function, and good carryover of cervical precautions/education, recommending home with 24 hr supervision. All OT needs met in the acute setting.   Recommendations for follow up therapy are one component of a multi-disciplinary discharge planning process, led by the attending physician.  Recommendations may be updated based on patient status, additional functional criteria and insurance authorization.   Follow Up Recommendations  No OT follow up;Supervision/Assistance - 24 hour    Equipment Recommendations  None recommended by OT    Recommendations for Other Services       Precautions / Restrictions Precautions Precautions: Cervical;Fall Precaution Booklet Issued: Yes (comment) Precaution Comments: No brace Restrictions Weight Bearing Restrictions: No Other Position/Activity Restrictions: Cervical precautions      Mobility Bed Mobility Overal bed mobility: Needs Assistance Bed Mobility: Rolling;Sidelying to Sit;Sit to Sidelying Rolling: Supervision Sidelying to sit: Supervision;HOB elevated  (slighty elevated)     Sit to sidelying: Supervision General bed mobility comments: Pt required min verbal cues for precuations and re-educated on log-rolling.    Transfers Overall transfer level: Needs assistance Equipment used: None Transfers: Sit to/from Stand Sit to Stand: Supervision         General transfer comment: for safety    Balance Overall balance assessment: Mild deficits observed, not formally tested                                         ADL either performed or assessed with clinical judgement   ADL Overall ADL's : Needs assistance/impaired Eating/Feeding: Supervision/ safety   Grooming: Supervision/safety;Oral care;Wash/dry face Grooming Details (indicate cue type and reason): Pt required Min verbal cues to avoid bending forward during oral care. Upper Body Bathing: Supervision/ safety;Sitting   Lower Body Bathing: Supervison/ safety;Sit to/from stand   Upper Body Dressing : Supervision/safety;Sitting   Lower Body Dressing: Supervision/safety;Sit to/from stand   Toilet Transfer: Sales executive;Ambulation Toilet Transfer Details (indicate cue type and reason): Pt needing supervision for safety Toileting- Clothing Manipulation and Hygiene: Supervision/safety;Sit to/from stand Toileting - Clothing Manipulation Details (indicate cue type and reason): Supervision for safety and min verbal cues to adhere to precautions Tub/ Shower Transfer: Nurse, learning disability Details (indicate cue type and reason): Pt completed tub shower transfer with min guard for safety Functional mobility during ADLs: Min guard;Cueing for safety General ADL Comments: Pt educated on safe tub shower transfer, safe oral care, UB and LB dressing, and toileting while adhering to cervical precautions. Pt educated on compensatory strategies for each and given handout. Pt with decreased functional LB strength and pain limiting ability to complete ADLs  without supervision and tub  shower transfers without min guard assistance.     Vision Baseline Vision/History: 0 No visual deficits Ability to See in Adequate Light: 0 Adequate Patient Visual Report: No change from baseline Vision Assessment?: No apparent visual deficits     Perception     Praxis      Pertinent Vitals/Pain Pain Assessment: 0-10 Pain Score: 10-Worst pain ever Pain Location: lower cervical spine Pain Descriptors / Indicators: Aching;Discomfort;Grimacing Pain Intervention(s): Monitored during session     Hand Dominance Right   Extremity/Trunk Assessment Upper Extremity Assessment Upper Extremity Assessment: Overall WFL for tasks assessed   Lower Extremity Assessment Lower Extremity Assessment: Defer to PT evaluation   Cervical / Trunk Assessment Cervical / Trunk Assessment: Other exceptions Cervical / Trunk Exceptions: s/p cervical surgery   Communication Communication Communication: No difficulties   Cognition Arousal/Alertness: Awake/alert Behavior During Therapy: WFL for tasks assessed/performed Overall Cognitive Status: Within Functional Limits for tasks assessed                                 General Comments: Able to follow mutistep commands.   General Comments       Exercises     Shoulder Instructions      Home Living Family/patient expects to be discharged to:: Private residence Living Arrangements: Alone Available Help at Discharge: Family;Available 24 hours/day Type of Home: House Home Access: Stairs to enter [employer] of Steps: 2 Entrance Stairs-Rails: Left Home Layout: One level     Bathroom Shower/Tub: Tub/shower unit;Walk-in shower   Bathroom Toilet: Handicapped height     Home Equipment: Shower seat;Bedside commode   Additional Comments: Pt plans to stay with sister post d/c. Sister has handicapped height toilet, tub shower, and walk-in shower. At home she has tub shower with grab bars.       Prior Functioning/Environment Level of Independence: Independent        Comments: Pt reports she was independent in ADLs, IADLs, and was still driving        OT Problem List: Decreased strength;Decreased range of motion;Pain      OT Treatment/Interventions:      OT Goals(Current goals can be found in the care plan section) Acute Rehab OT Goals Patient Stated Goal: Return home OT Goal Formulation: All assessment and education complete, DC therapy  OT Frequency:     Barriers to D/C:            Co-evaluation              AM-PAC OT "6 Clicks" Daily Activity     Outcome Measure Help from another person eating meals?: None Help from another person taking care of personal grooming?: A Little Help from another person toileting, which includes using toliet, bedpan, or urinal?: A Little Help from another person bathing (including washing, rinsing, drying)?: A Little Help from another person to put on and taking off regular upper body clothing?: A Little Help from another person to put on and taking off regular lower body clothing?: A Little 6 Click Score: 19   End of Session Equipment Utilized During Treatment: Gait belt Nurse Communication: Mobility status  Activity Tolerance: Patient tolerated treatment well Patient left: in bed;with bed alarm set;with call bell/phone within reach  OT Visit Diagnosis: Muscle weakness (generalized) (M62.81);Pain Pain - part of body:  (back)                Time: 2130-8657 OT Time Calculation (min):  22 min Charges:  OT General Charges $OT Visit: 1 Visit OT Evaluation $OT Eval Low Complexity: 1 Low  Jackquline Denmark, OTS Acute Rehab Office: [phone]   Sierra Leach 03/28/2021, 9:25 AM

## 2021-03-28 NOTE — Progress Notes (Signed)
Subjective: Patient reports  patient doing very well neck pain manageable denies any numbness tingling or weakness in her arms  Objective: Vital signs in last 24 hours: Temp:  [97.5 F (36.4 C)-98.1 F (36.7 C)] 97.5 F (36.4 C) (09/20 0358) Pulse Rate:  [68-82] 78 (09/20 0358) Resp:  [11-20] 18 (09/20 0358) BP: (116-177)/(76-93) 141/91 (09/20 0358) SpO2:  [94 %-97 %] 96 % (09/20 0358) Weight:  [104.3 kg] 104.3 kg (09/19 1129)  Intake/Output from previous day: 09/19 0701 - 09/20 0700 In: 2875.3 [P.O.:360; I.V.:1665.3; IV Piggyback:650] Out: 340 [Drains:90; Blood:250] Intake/Output this shift: No intake/output data recorded.  Strength 5/5 wound clean dry and intact  Lab Results: No results for input(s): WBC, HGB, HCT, PLT in the last 72 hours. BMET No results for input(s): NA, K, CL, CO2, GLUCOSE, BUN, CREATININE, CALCIUM in the last 72 hours.  Studies/Results: DG Cervical Spine 1 View  Result Date: 03/27/2021 CLINICAL DATA:  C7/T1 fusion EXAM: DG CERVICAL SPINE - 1 VIEW COMPARISON:  03/07/2021 FLUOROSCOPY TIME:  Radiation Exposure Index (as provided by the fluoroscopic device): 34.0 mGy If the device does not provide the exposure index: Fluoroscopy Time:  1 minute 19 seconds Number of Acquired Images:  1 FINDINGS: Single frontal spot film shows pedicle screws at C7 and T1. Posterior fixation elements is not visualized at this time. IMPRESSION: Pedicle screws at C7 and T1 consistent with the given clinical history. Electronically Signed   By: Alcide Clever M.D.   On: 03/27/2021 18:56   DG C-Arm 1-60 Min-No Report  Result Date: 03/27/2021 Fluoroscopy was utilized by the requesting physician.  No radiographic interpretation.   DG C-Arm 1-60 Min-No Report  Result Date: 03/27/2021 Fluoroscopy was utilized by the requesting physician.  No radiographic interpretation.    Assessment/Plan: Postop day 1 posterior cervical fusion doing very well significant provement preoperative neck  pain continue to observe work with physical therapy drain outputs still little bit high to discontinue.  Probable discharge tomorrow  LOS: 1 day     Mariam Dollar 03/28/2021, 7:43 AM

## 2021-03-28 NOTE — Evaluation (Signed)
Physical Therapy Evaluation Patient Details Name: Sierra Leach MRN: [id number] DOB: [birth year] Today's Date: 03/28/2021  History of Present Illness  Pt is a 70 y/o female who presents s/p C7-T1 posterior cervical fusion on 03/27/2021. 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   Clinical Impression  Pt admitted with above diagnosis. At the time of PT eval, pt was able to demonstrate transfers and ambulation with gross min guard assist to supervision for safety and RW for support. Pt was educated on precautions, positioning recommendations, appropriate activity progression, and car transfer. Pt currently with functional limitations due to the deficits listed below (see PT Problem List). Pt will benefit from skilled PT to increase their independence and safety with mobility to allow discharge to the venue listed below.         Recommendations for follow up therapy are one component of a multi-disciplinary discharge planning process, led by the attending physician.  Recommendations may be updated based on patient status, additional functional criteria and insurance authorization.  Follow Up Recommendations No PT follow up;Supervision for mobility/OOB    Equipment Recommendations  None recommended by PT    Recommendations for Other Services       Precautions / Restrictions Precautions Precautions: Cervical;Fall Precaution Booklet Issued: Yes (comment) Precaution Comments: Reviewed handout and pt was cued for precautions during functional mobility Required Braces or Orthoses:  (No brace needed order) Restrictions Weight Bearing Restrictions: No Other Position/Activity Restrictions: Cervical precautions      Mobility  Bed Mobility Overal bed mobility: Needs Assistance Bed Mobility: Rolling;Sidelying to Sit;Sit to Sidelying Rolling: Supervision Sidelying to sit: Supervision;HOB elevated  (slighty elevated)     Sit to sidelying: Supervision General bed mobility comments: Mild HOB elevation (<10), and rails lowered to simulate home environment. Encouraged pt to prop onto pillows at home for slight elevation. VC's for optimal log roll technique. Attempting to lift head off of pillows to roll instead of keeping head supported on pillow.    Transfers Overall transfer level: Needs assistance Equipment used: None Transfers: Sit to/from Stand Sit to Stand: Supervision         General transfer comment: for safety  Ambulation/Gait Ambulation/Gait assistance: Supervision Gait Distance (Feet): 250 Feet Assistive device: Rolling walker (2 wheeled);None Gait Pattern/deviations: Step-through pattern;Decreased stride length;Trunk flexed;Wide base of support Gait velocity: Decreased Gait velocity interpretation: 1.31 - 2.62 ft/sec, indicative of limited community ambulator General Gait Details: Slow and guarded. Pt initially without AD but reaching out for external support in hallway. With RW, pt was able to improve gait speed and overall steadiness of gait.  Stairs Stairs: Yes Stairs assistance: Min guard Stair Management: Two rails;Step to pattern;Forwards Number of Stairs: 2 General stair comments: VC's for sequencing and general safety.  Wheelchair Mobility    Modified Rankin (Stroke Patients Only)       Balance Overall balance assessment: Mild deficits observed, not formally tested                                           Pertinent Vitals/Pain Pain Assessment: 0-10 Pain Score: 8  Pain Location: lower cervical spine Pain Descriptors / Indicators: Aching;Discomfort;Grimacing Pain Intervention(s): Limited activity within patient's tolerance;Monitored during session;Repositioned    Home Living Family/patient expects to be discharged to:: Private residence Living Arrangements: Alone Available Help at Discharge: Family;Available 24  hours/day Type of Home: House Home Access: Stairs to enter Entrance Stairs-Rails: Left Entrance Stairs-Number of Steps: 2 Home Layout: One level Home Equipment: Shower seat;Bedside commode Additional Comments: Pt plans to stay with sister and brother-in-law post d/c. Sister has handicapped height toilet, tub shower, and walk-in shower. At home she has tub shower with grab bars.    Prior Function Level of Independence: Independent         Comments: Pt reports she was independent in ADLs, IADLs, and was still driving. Works part time - 5 hours a day.     Hand Dominance   Dominant Hand: Right    Extremity/Trunk Assessment   Upper Extremity Assessment Upper Extremity Assessment: Defer to OT evaluation    Lower Extremity Assessment Lower Extremity Assessment: Generalized weakness (Consistent with pre-op diagnosis, general muscular fatigue with activity)    Cervical / Trunk Assessment Cervical / Trunk Assessment: Other exceptions Cervical / Trunk Exceptions: s/p cervical surgery  Communication   Communication: No difficulties  Cognition Arousal/Alertness: Awake/alert Behavior During Therapy: WFL for tasks assessed/performed Overall Cognitive Status: Within Functional Limits for tasks assessed                                 General Comments: Recalled 3/3 precautions at end of session.      General Comments      Exercises     Assessment/Plan    PT Assessment Patient needs continued PT services  PT Problem List Decreased strength;Decreased activity tolerance;Decreased balance;Decreased mobility;Decreased knowledge of use of DME;Decreased safety awareness;Decreased knowledge of precautions;Pain       PT Treatment Interventions DME instruction;Gait training;Functional mobility training;Therapeutic exercise;Therapeutic activities;Neuromuscular re-education;Patient/family education;Stair training    PT Goals (Current goals can be found in the Care Plan  section)  Acute Rehab PT Goals Patient Stated Goal: Return home today PT Goal Formulation: With patient Time For Goal Achievement: 04/04/21 Potential to Achieve Goals: Good    Frequency Min 5X/week   Barriers to discharge        Co-evaluation               AM-PAC PT "6 Clicks" Mobility  Outcome Measure Help needed turning from your back to your side while in a flat bed without using bedrails?: A Little Help needed moving from lying on your back to sitting on the side of a flat bed without using bedrails?: A Little Help needed moving to and from a bed to a chair (including a wheelchair)?: A Little Help needed standing up from a chair using your arms (e.g., wheelchair or bedside chair)?: A Little Help needed to walk in hospital room?: A Little Help needed climbing 3-5 steps with a railing? : A Little 6 Click Score: 18    End of Session Equipment Utilized During Treatment: Gait belt Activity Tolerance: Patient tolerated treatment well Patient left: in bed;with call bell/phone within reach Nurse Communication: Mobility status PT Visit Diagnosis: Unsteadiness on feet (R26.81);Pain Pain - part of body:  (neck)    Time: 9735-3299 PT Time Calculation (min) (ACUTE ONLY): 24 min   Charges:   PT Evaluation $PT Eval Low Complexity: 1 Low PT Treatments $Gait Training: 8-22 mins        Conni Slipper, PT, DPT Acute Rehabilitation Services Pager: [phone] Office: [phone]   Marylynn Pearson 03/28/2021, 10:32 AM

## 2021-03-29 MED ORDER — METHOCARBAMOL 500 MG PO TABS: 500.0000 mg | ORAL_TABLET | Freq: Four times a day (QID) | ORAL | 0 refills | Status: DC

## 2021-03-29 MED ORDER — OXYCODONE-ACETAMINOPHEN 5-325 MG PO TABS
1.0000 | ORAL_TABLET | ORAL | 0 refills | Status: AC | PRN
Start: 2021-03-29 — End: 2022-03-29

## 2021-03-29 NOTE — Plan of Care (Signed)
Pt given D/C instructions with verbal understanding. Rx's were sent to the pharmacy by MD. Pt's incision is clean and dry with no sign of infection. Pt's IV and Hemovac were removed prior to D/C. Pt D/C'd home via wheelchair per MD order. Pt is stable @ D/C and has no other needs at this time. Abdimalik Mayorquin, RN  

## 2021-03-29 NOTE — Discharge Summary (Signed)
Physician Discharge Summary  Patient ID: DEANETTE TULLIUS MRN: [id number] DOB/AGE: 1950/10/11 70 y.o.  Admit date: 03/27/2021 Discharge date: 03/29/2021  Admission Diagnoses:  Grade 1 spondylolisthesis C7-T1 with instability and neck pain with spinal stenosis and foraminal stenosis at C7-T1   Discharge Diagnoses: same   Discharged Condition: good  Hospital Course: The patient was admitted on 03/27/2021 and taken to the operating room where the patient underwent posterior cervical fusion C7-T1. The patient tolerated the procedure well and was taken to the recovery room and then to the floor in stable condition. The hospital course was routine. There were no complications. The wound remained clean dry and intact. Pt had appropriate neck soreness. No complaints of arm pain or new N/T/W. The patient remained afebrile with stable vital signs, and tolerated a regular diet. The patient continued to increase activities, and pain was well controlled with oral pain medications.   Consults: None  Significant Diagnostic Studies:  Results for orders placed or performed during the hospital encounter of 03/27/21  Protime-INR  Result Value Ref Range   Prothrombin Time 16.1 (H) 11.4 - 15.2 seconds   INR 1.3 (H) 0.8 - 1.2    DG Cervical Spine 1 View  Result Date: 03/27/2021 CLINICAL DATA:  C7/T1 fusion EXAM: DG CERVICAL SPINE - 1 VIEW COMPARISON:  03/07/2021 FLUOROSCOPY TIME:  Radiation Exposure Index (as provided by the fluoroscopic device): 34.0 mGy If the device does not provide the exposure index: Fluoroscopy Time:  1 minute 19 seconds Number of Acquired Images:  1 FINDINGS: Single frontal spot film shows pedicle screws at C7 and T1. Posterior fixation elements is not visualized at this time. IMPRESSION: Pedicle screws at C7 and T1 consistent with the given clinical history. Electronically Signed   By: Alcide Clever M.D.   On: 03/27/2021 18:56   DG C-Arm 1-60 Min-No Report  Result Date:  03/27/2021 Fluoroscopy was utilized by the requesting physician.  No radiographic interpretation.   DG C-Arm 1-60 Min-No Report  Result Date: 03/27/2021 Fluoroscopy was utilized by the requesting physician.  No radiographic interpretation.    Antibiotics:  Anti-infectives (From admission, onward)    Start     Dose/Rate Route Frequency Ordered Stop   03/27/21 2330  vancomycin (VANCOREADY) IVPB 1250 mg/250 mL        1,250 mg 166.7 mL/hr over 90 Minutes Intravenous  Once 03/27/21 1918 03/27/21 2312   03/27/21 1945  ceFAZolin (ANCEF) IVPB 2g/100 mL premix  Status:  Discontinued        2 g 200 mL/hr over 30 Minutes Intravenous Every 8 hours 03/27/21 1856 03/27/21 1918   03/27/21 1200  vancomycin (VANCOREADY) IVPB 1500 mg/300 mL        1,500 mg 150 mL/hr over 120 Minutes Intravenous To Surgery 03/27/21 1124 03/27/21 1802       Discharge Exam: Blood pressure 111/66, pulse 72, temperature 97.9 F (36.6 C), resp. rate 18, height 5\' 4"  (1.626 m), weight 104.3 kg, SpO2 100 %. Neurologic: Grossly normal Ambulating and voiding well, incision cdi   Discharge Medications:   Allergies as of 03/29/2021       Reactions   Penicillins Hives, Itching, Rash, Other (See Comments)   Has patient had a PCN reaction causing immediate rash, facial/tongue/throat swelling, SOB or lightheadedness with hypotension: Yes Has patient had a PCN reaction causing severe rash involving mucus membranes or skin necrosis: No Has patient had a PCN reaction that required hospitalization: No Has patient had a PCN reaction occurring within the  last 10 years: No If all of the above answers are "NO", then may proceed with Cephalosporin use.   Sulfa Antibiotics Other (See Comments)   Breaks mouth out in blisters   Talwin [pentazocine] Other (See Comments)   Hallucinations, "out of body experience" back in her early 56's   Adhesive [tape] Rash        Medication List     TAKE these medications    acetaminophen 500  MG tablet Commonly known as: TYLENOL Take 1,500 mg by mouth 2 (two) times daily as needed for mild pain or headache.   allopurinol 100 MG tablet Commonly known as: ZYLOPRIM Take 100 mg by mouth daily.   atorvastatin 20 MG tablet Commonly known as: LIPITOR Take 20 mg by mouth every Sunday.   carvedilol 12.5 MG tablet Commonly known as: COREG Take 12.5 mg by mouth 2 (two) times daily with a meal.   citalopram 40 MG tablet Commonly known as: CELEXA Take 40 mg by mouth daily.   CORICIDIN HBP PO Take 1 tablet by mouth daily as needed (allergies).   estradiol 0.5 MG tablet Commonly known as: ESTRACE Take 0.5 mg by mouth daily.   meclizine 25 MG tablet Commonly known as: ANTIVERT Take 25 mg by mouth every 6 (six) hours as needed for dizziness.   methocarbamol 500 MG tablet Commonly known as: Robaxin Take 1 tablet (500 mg total) by mouth 4 (four) times daily.   oxyCODONE-acetaminophen 5-325 MG tablet Commonly known as: Percocet Take 1 tablet by mouth every 4 (four) hours as needed for severe pain.   sennosides-docusate sodium 8.6-50 MG tablet Commonly known as: SENOKOT-S Take 1 tablet by mouth at bedtime.   triamcinolone ointment 0.1 % Commonly known as: KENALOG Apply 1 application topically 4 (four) times daily as needed (eczema).   VISINE OP Place 1 drop into both eyes daily as needed (allergies).   warfarin 3 MG tablet Commonly known as: COUMADIN Take 3-6 mg by mouth See admin instructions. Take 3 mg by mouth daily at night except on Mon and Thurs take 6 mg at night        Disposition: home   Final Dx: posterior cervical fusion C7-T1  Discharge Instructions     Call MD for:  difficulty breathing, headache or visual disturbances   Complete by: As directed    Call MD for:  hives   Complete by: As directed    Call MD for:  persistant dizziness or light-headedness   Complete by: As directed    Call MD for:  persistant nausea and vomiting   Complete by: As  directed    Call MD for:  redness, tenderness, or signs of infection (pain, swelling, redness, odor or green/yellow discharge around incision site)   Complete by: As directed    Call MD for:  severe uncontrolled pain   Complete by: As directed    Call MD for:  temperature >100.4   Complete by: As directed    Diet - low sodium heart healthy   Complete by: As directed    Driving Restrictions   Complete by: As directed    No driving for 2 weeks, no riding in the car for 1 week   Increase activity slowly   Complete by: As directed    Lifting restrictions   Complete by: As directed    No lifting more than 8 lbs   Remove dressing in 48 hours   Complete by: As directed  Signed: Tiana Loft Oneal Schoenberger 03/29/2021, 9:06 AM

## 2021-03-29 NOTE — Progress Notes (Signed)
Physical Therapy Treatment Patient Details Name: Sierra Leach MRN: [id number] DOB: Jun 16, 1951 Today's Date: 03/29/2021   History of Present Illness Pt is a 70 y/o female who presents s/p C7-T1 posterior cervical fusion on 03/27/2021. 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    PT Comments    Pt progressing well with post-op mobility. She was able to demonstrate transfers and ambulation with gross modified independence to supervision for safety and RW for support. Reinforced education on precautions, positioning recommendations, appropriate activity progression, and car transfer. Will continue to follow.      Recommendations for follow up therapy are one component of a multi-disciplinary discharge planning process, led by the attending physician.  Recommendations may be updated based on patient status, additional functional criteria and insurance authorization.  Follow Up Recommendations  No PT follow up;Supervision for mobility/OOB     Equipment Recommendations  None recommended by PT    Recommendations for Other Services       Precautions / Restrictions Precautions Precautions: Cervical;Fall Precaution Booklet Issued: Yes (comment) Precaution Comments: Reviewed handout and pt was cued for precautions during functional mobility Required Braces or Orthoses:  (No brace needed order) Restrictions Weight Bearing Restrictions: No Other Position/Activity Restrictions: Cervical precautions     Mobility  Bed Mobility Overal bed mobility: Modified Independent Bed Mobility: Rolling;Sidelying to Sit           General bed mobility comments: Good log roll technique demonstrated today.    Transfers Overall transfer level: Needs assistance Equipment used: None Transfers: Sit to/from Stand Sit to Stand: Supervision         General transfer comment: Light supervision for  safety. No assist required.  Ambulation/Gait Ambulation/Gait assistance: Supervision Gait Distance (Feet): 250 Feet Assistive device: Rolling walker (2 wheeled);None Gait Pattern/deviations: Step-through pattern;Decreased stride length;Trunk flexed;Wide base of support Gait velocity: Decreased Gait velocity interpretation: 1.31 - 2.62 ft/sec, indicative of limited community ambulator General Gait Details: Wheezing throughout mobility however not worse with ambulation and pt 2/4 DOE compared to 3/4 yesterday. No assist required. Pt self-monitoring for fatigue.   Stairs             Wheelchair Mobility    Modified Rankin (Stroke Patients Only)       Balance Overall balance assessment: Mild deficits observed, not formally tested                                          Cognition Arousal/Alertness: Awake/alert Behavior During Therapy: WFL for tasks assessed/performed Overall Cognitive Status: Within Functional Limits for tasks assessed                                        Exercises      General Comments        Pertinent Vitals/Pain Pain Assessment: Faces Faces Pain Scale: Hurts little more Pain Location: Incision site Pain Descriptors / Indicators: Operative site guarding;Sore (itching) Pain Intervention(s): Limited activity within patient's tolerance;Monitored during session;Repositioned    Home Living                      Prior Function            PT Goals (current goals can now be  found in the care plan section) Acute Rehab PT Goals Patient Stated Goal: Return home today PT Goal Formulation: With patient Time For Goal Achievement: 04/04/21 Potential to Achieve Goals: Good Progress towards PT goals: Progressing toward goals    Frequency    Min 5X/week      PT Plan Current plan remains appropriate    Co-evaluation              AM-PAC PT "6 Clicks" Mobility   Outcome Measure  Help needed  turning from your back to your side while in a flat bed without using bedrails?: A Little Help needed moving from lying on your back to sitting on the side of a flat bed without using bedrails?: A Little Help needed moving to and from a bed to a chair (including a wheelchair)?: A Little Help needed standing up from a chair using your arms (e.g., wheelchair or bedside chair)?: A Little Help needed to walk in hospital room?: A Little Help needed climbing 3-5 steps with a railing? : A Little 6 Click Score: 18    End of Session Equipment Utilized During Treatment: Gait belt Activity Tolerance: Patient tolerated treatment well Patient left: in bed;with call bell/phone within reach Nurse Communication: Mobility status PT Visit Diagnosis: Unsteadiness on feet (R26.81);Pain Pain - part of body:  (neck)     Time: 8466-5993 PT Time Calculation (min) (ACUTE ONLY): 12 min  Charges:  $Gait Training: 8-22 mins                     Conni Slipper, PT, DPT Acute Rehabilitation Services Pager: [phone] Office: [phone]    Marylynn Pearson 03/29/2021, 12:00 PM

## 2021-08-24 NOTE — Telephone Encounter (Signed)
This encounter was created in error - please disregard.

## 2023-06-19 ENCOUNTER — Other Ambulatory Visit: Payer: Self-pay | Admitting: Neurosurgery

## 2023-07-05 NOTE — Progress Notes (Signed)
Surgical Instructions   Your procedure is scheduled on July 17, 2023. Report to [facility] Main Entrance "A" at 6:30 A.M., then check in with the Admitting office. Any questions or running late day of surgery: call [phone]  Questions prior to your surgery date: call [phone], Monday-Friday, 8am-4pm. If you experience any cold or flu symptoms such as cough, fever, chills, shortness of breath, etc. between now and your scheduled surgery, please notify us at the above number.     Remember:  Do not eat or drink after midnight the night before your surgery  Take these medicines the morning of surgery with A SIP OF WATER  allopurinol (ZYLOPRIM)  atorvastatin (LIPITOR)  carvedilol (COREG)  citalopram (CELEXA)  sennosides-docusate sodium (SENOKOT-S)    May take these medicines IF NEEDED: acetaminophen (TYLENOL)  famotidine (PEPCID)  HYDROcodone-acetaminophen (NORCO/VICODIN)  meclizine (ANTIVERT)  methocarbamol (ROBAXIN)   Follow your surgeon's instructions on when to stop warfarin (COUMADIN) .  If no instructions were given by your surgeon then you will need to call the office to get those instructions.      One week prior to surgery, STOP taking any Aspirin (unless otherwise instructed by your surgeon) Aleve, Naproxen, Ibuprofen, Motrin, Advil, Goody's, BC's, all herbal medications, fish oil, and non-prescription vitamins.                      Do NOT Smoke (Tobacco/Vaping) for 24 hours prior to your procedure.  If you use a CPAP at night, you may bring your mask/headgear for your overnight stay.   You will be asked to remove any contacts, glasses, piercing's, hearing aid's, dentures/partials prior to surgery. Please bring cases for these items if needed.    Patients discharged the day of surgery will not be allowed to drive home, and someone needs to stay with them for 24 hours.  SURGICAL WAITING ROOM VISITATION Patients may have no more than 2 support people in the  waiting area - these visitors may rotate.   Pre-op nurse will coordinate an appropriate time for 1 ADULT support person, who may not rotate, to accompany patient in pre-op.  Children under the age of 82 must have an adult with them who is not the patient and must remain in the main waiting area with an adult.  If the patient needs to stay at the hospital during part of their recovery, the visitor guidelines for inpatient rooms apply.  Please refer to the [facility] website for the visitor guidelines for any additional information.   If you received a COVID test during your pre-op visit  it is requested that you wear a mask when out in public, stay away from anyone that may not be feeling well and notify your surgeon if you develop symptoms. If you have been in contact with anyone that has tested positive in the last 10 days please notify you surgeon.      Pre-operative 5 CHG Bathing Instructions   You can play a key role in reducing the risk of infection after surgery. Your skin needs to be as free of germs as possible. You can reduce the number of germs on your skin by washing with CHG (chlorhexidine gluconate) soap before surgery. CHG is an antiseptic soap that kills germs and continues to kill germs even after washing.   DO NOT use if you have an allergy to chlorhexidine/CHG or antibacterial soaps. If your skin becomes reddened or irritated, stop using the CHG and notify one of  our RNs at [phone].   Please shower with the CHG soap starting 4 days before surgery using the following schedule:     Please keep in mind the following:  DO NOT shave, including legs and underarms, starting the day of your first shower.   You may shave your face at any point before/day of surgery.  Place clean sheets on your bed the day you start using CHG soap. Use a clean washcloth (not used since being washed) for each shower. DO NOT sleep with pets once you start using the CHG.   CHG Shower  Instructions:  Wash your face and private area with normal soap. If you choose to wash your hair, wash first with your normal shampoo.  After you use shampoo/soap, rinse your hair and body thoroughly to remove shampoo/soap residue.  Turn the water OFF and apply about 3 tablespoons (45 ml) of CHG soap to a CLEAN washcloth.  Apply CHG soap ONLY FROM YOUR NECK DOWN TO YOUR TOES (washing for 3-5 minutes)  DO NOT use CHG soap on face, private areas, open wounds, or sores.  Pay special attention to the area where your surgery is being performed.  If you are having back surgery, having someone wash your back for you may be helpful. Wait 2 minutes after CHG soap is applied, then you may rinse off the CHG soap.  Pat dry with a clean towel  Put on clean clothes/pajamas   If you choose to wear lotion, please use ONLY the CHG-compatible lotions on the back of this paper.   Additional instructions for the day of surgery: DO NOT APPLY any lotions, deodorants, cologne, or perfumes.   Do not bring valuables to the hospital. [facility] is not responsible for any belongings/valuables. Do not wear nail polish, gel polish, artificial nails, or any other type of covering on natural nails (fingers and toes) Do not wear jewelry or makeup Put on clean/comfortable clothes.  Please brush your teeth.  Ask your nurse before applying any prescription medications to the skin.     CHG Compatible Lotions   Aveeno Moisturizing lotion  Cetaphil Moisturizing Cream  Cetaphil Moisturizing Lotion  Clairol Herbal Essence Moisturizing Lotion, Dry Skin  Clairol Herbal Essence Moisturizing Lotion, Extra Dry Skin  Clairol Herbal Essence Moisturizing Lotion, Normal Skin  Curel Age Defying Therapeutic Moisturizing Lotion with Alpha Hydroxy  Curel Extreme Care Body Lotion  Curel Soothing Hands Moisturizing Hand Lotion  Curel Therapeutic Moisturizing Cream, Fragrance-Free  Curel Therapeutic Moisturizing Lotion,  Fragrance-Free  Curel Therapeutic Moisturizing Lotion, Original Formula  Eucerin Daily Replenishing Lotion  Eucerin Dry Skin Therapy Plus Alpha Hydroxy Crme  Eucerin Dry Skin Therapy Plus Alpha Hydroxy Lotion  Eucerin Original Crme  Eucerin Original Lotion  Eucerin Plus Crme Eucerin Plus Lotion  Eucerin TriLipid Replenishing Lotion  Keri Anti-Bacterial Hand Lotion  Keri Deep Conditioning Original Lotion Dry Skin Formula Softly Scented  Keri Deep Conditioning Original Lotion, Fragrance Free Sensitive Skin Formula  Keri Lotion Fast Absorbing Fragrance Free Sensitive Skin Formula  Keri Lotion Fast Absorbing Softly Scented Dry Skin Formula  Keri Original Lotion  Keri Skin Renewal Lotion Keri Silky Smooth Lotion  Keri Silky Smooth Sensitive Skin Lotion  Nivea Body Creamy Conditioning Oil  Nivea Body Extra Enriched Teacher, adult education Moisturizing Lotion Nivea Crme  Nivea Skin Firming Lotion  NutraDerm 30 Skin Lotion  NutraDerm Skin Lotion  NutraDerm Therapeutic Skin Cream  NutraDerm Therapeutic Skin Lotion  ProShield Protective  Hand Cream  Provon moisturizing lotion  Please read over the following fact sheets that you were given.

## 2023-07-08 ENCOUNTER — Other Ambulatory Visit: Payer: Self-pay

## 2023-07-08 ENCOUNTER — Encounter (HOSPITAL_COMMUNITY)
Admission: RE | Admit: 2023-07-08 | Discharge: 2023-07-08 | Disposition: A | Discharge status: Home / Self Care | Payer: Medicare HMO | Source: Ambulatory Visit | Attending: Neurosurgery | Admitting: Neurosurgery

## 2023-07-08 ENCOUNTER — Encounter (HOSPITAL_COMMUNITY): Payer: Self-pay

## 2023-07-08 VITALS — BP 140/100 | HR 73 | Temp 98.1°F | Resp 18 | Ht 64.0 in | Wt 239.2 lb

## 2023-07-08 DIAGNOSIS — Z7901 Long term (current) use of anticoagulants: Secondary | ICD-10-CM | POA: Diagnosis present

## 2023-07-08 DIAGNOSIS — Z01812 Encounter for preprocedural laboratory examination: Secondary | ICD-10-CM | POA: Diagnosis not present

## 2023-07-08 DIAGNOSIS — Z01818 Encounter for other preprocedural examination: Secondary | ICD-10-CM

## 2023-07-08 LAB — COMPREHENSIVE METABOLIC PANEL
ALT: 15 U/L (ref 0–44)
AST: 21 U/L (ref 15–41)
Albumin: 3.1 g/dL — ABNORMAL LOW (ref 3.5–5.0)
Alkaline Phosphatase: 62 U/L (ref 38–126)
Anion gap: 10 (ref 5–15)
BUN: 15 mg/dL (ref 8–23)
CO2: 25 mmol/L (ref 22–32)
Calcium: 9 mg/dL (ref 8.9–10.3)
Chloride: 101 mmol/L (ref 98–111)
Creatinine, Ser: 0.96 mg/dL (ref 0.44–1.00)
GFR, Estimated: >60 mL/min (ref >60)
Glucose, Bld: 119 mg/dL — ABNORMAL HIGH (ref 70–99)
Potassium: 3.8 mmol/L (ref 3.5–5.1)
Sodium: 136 mmol/L (ref 135–145)
Total Bilirubin: 0.3 mg/dL (ref 0.0–1.2)
Total Protein: 6.7 g/dL (ref 6.5–8.1)

## 2023-07-08 LAB — CBC
HCT: 41.6 % (ref 36.0–46.0)
Hemoglobin: 13.5 g/dL (ref 12.0–15.0)
MCH: 30.4 pg (ref 26.0–34.0)
MCHC: 32.5 g/dL (ref 30.0–36.0)
MCV: 93.7 fL (ref 80.0–100.0)
Platelets: 221 10*3/uL (ref 150–400)
RBC: 4.44 MIL/uL (ref 3.87–5.11)
RDW: 13.8 % (ref 11.5–15.5)
WBC: 6.1 10*3/uL (ref 4.0–10.5)
nRBC: 0 % (ref 0.0–0.2)

## 2023-07-08 LAB — PROTIME-INR
INR: 3 — ABNORMAL HIGH (ref 0.8–1.2)
Prothrombin Time: 31.6 s — ABNORMAL HIGH (ref 11.4–15.2)

## 2023-07-08 LAB — TYPE AND SCREEN
ABO/RH(D): B POS
Antibody Screen: NEGATIVE

## 2023-07-08 LAB — SURGICAL PCR SCREEN
MRSA, PCR: NEGATIVE
Staphylococcus aureus: NEGATIVE

## 2023-07-08 NOTE — Progress Notes (Signed)
PCP - Ambrose Finland, DNP   Cardiologist -  Cristopher Peru, NP  [facility] - Internal Medicine [place] (Anticoag )  PPM/ICD - denies Device Orders - n/a Rep Notified - n/a  Chest x-ray -  EKG - 02-2023 (requested) Stress Test - 02-18-23 ECHO - 06-17-19 Cardiac Cath -   Sleep Study - denies CPAP -   DM- denies  Blood Thinner Instructions: warfarin (COUMADIN) Patient has instructions as to when to stop coumadin and begin Lovenox and has a appointment date prior to surgery for PT-INR and then a date for after surgery. Aspirin Instructions:  ERAS Protcol - NPO   COVID TEST- N/a   Anesthesia review: Yes Hx of HTN, stroke, requested EKG /  Patient denies shortness of breath, fever, cough and chest pain at PAT appointment   All instructions explained to the patient, with a verbal understanding of the material. Patient agrees to go over the instructions while at home for a better understanding. Patient also instructed to self quarantine after being tested for COVID-19. The opportunity to ask questions was provided.

## 2023-07-09 NOTE — Progress Notes (Signed)
 Spoke with Montie Server NP  at [facility] regarding patient's lab result from 07-08-23 PT/ INR 31.6 3.0.  Per Montie patient does have instructions to hold Coumadin  starting Friday and bridging to Lovenox for up coming surgery. Per Montie she may call patient and instructed to hold a extra due to lab results. Anesthesia aware as well PT/INR order for day of surgery

## 2023-07-09 NOTE — Progress Notes (Signed)
Attempt to leave message with Dr Lonie Peak office regarding patient's PT/INR office is closed for the holiday

## 2023-07-11 NOTE — Anesthesia Preprocedure Evaluation (Addendum)
 Anesthesia Evaluation  Patient identified by MRN, date of birth, ID band Patient awake    Reviewed: Allergy & Precautions, NPO status , Patient's Chart, lab work & pertinent test results  History of Anesthesia Complications (+) PONV and history of anesthetic complications  Airway Mallampati: III  TM Distance: <3 FB Neck ROM: Limited    Dental  (+) Dental Advisory Given   Pulmonary neg pulmonary ROS   Pulmonary exam normal        Cardiovascular hypertension, Pt. on medications and Pt. on home beta blockers + Peripheral Vascular Disease and + DVT (Hx of PE on coumadin . Lovenox bridge. Last dose yest morning. Last coumadin  1/2)  Normal cardiovascular exam     Neuro/Psych CVA    GI/Hepatic Neg liver ROS,GERD  ,,  Endo/Other    Renal/GU      Musculoskeletal   Abdominal   Peds  Hematology   Anesthesia Other Findings   Reproductive/Obstetrics                             Anesthesia Physical Anesthesia Plan  ASA: 3  Anesthesia Plan: General   Post-op Pain Management: Ofirmev  IV (intra-op)*   Induction: Intravenous  PONV Risk Score and Plan: 4 or greater and Dexamethasone , Ondansetron  and Treatment may vary due to age or medical condition  Airway Management Planned: Oral ETT and Video Laryngoscope Planned  Additional Equipment:   Intra-op Plan:   Post-operative Plan: Extubation in OR  Informed Consent: I have reviewed the patients History and Physical, chart, labs and discussed the procedure including the risks, benefits and alternatives for the proposed anesthesia with the patient or authorized representative who has indicated his/her understanding and acceptance.     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 She is on lovenox bridge per her providers at [facility].    Longstanding history of abnormal EKG with lateral T wave changes.  Nuclear stress test 02/18/23 was low risk, nonischemic.   Preop labs reviewed, unremarkable.  EKG 02/17/2023 (Care Everywhere): Sinus bradycardia.  Rate 58.  T wave abnormality, consider anterolateral ischemia.  MRA head/neck 02/18/23 (care everywhere): Normal MR angiography of the neck. No carotid bifurcation stenosis.  No vertebral artery stenosis.   Nuclear stress 02/18/23 (care everywhere): 1. No reversible ischemia or infarction.   2. Normal left ventricular wall motion.   3. Left ventricular ejection fraction is 67%.   4. Non invasive risk stratification*: Low   TTE 06/17/2019 (Care Everywhere): SUMMARY  The left ventricular size is normal.  Mild left ventricular hypertrophy   Left ventricular systolic function is normal.  LV ejection fraction = 65-70%.   Left ventricular filling pattern is prolonged relaxation.  The left ventricular wall motion is normal.  The right ventricular systolic function is normal.  There is mild mitral regurgitation.  The aortic sinus is normal size.  IVC size was normal.  There is no pericardial effusion.  There is no comparison study available.   )        Anesthesia Quick Evaluation

## 2023-07-17 ENCOUNTER — Inpatient Hospital Stay (HOSPITAL_COMMUNITY): Payer: Medicare Other | Admitting: Physician Assistant

## 2023-07-17 ENCOUNTER — Other Ambulatory Visit: Payer: Self-pay

## 2023-07-17 ENCOUNTER — Encounter (HOSPITAL_COMMUNITY)
Admission: RE | Disposition: A | Discharge status: Home Health | Payer: Self-pay | Source: Ambulatory Visit | Attending: Neurosurgery

## 2023-07-17 ENCOUNTER — Inpatient Hospital Stay (HOSPITAL_COMMUNITY): Payer: Medicare Other

## 2023-07-17 ENCOUNTER — Inpatient Hospital Stay (HOSPITAL_COMMUNITY)
Admission: RE | Admit: 2023-07-17 | Discharge: 2023-07-18 | DRG: 451 | Disposition: A | Discharge status: Home Health | Payer: Medicare Other | Source: Ambulatory Visit | Attending: Neurosurgery | Admitting: Neurosurgery

## 2023-07-17 ENCOUNTER — Inpatient Hospital Stay (HOSPITAL_COMMUNITY): Payer: Self-pay | Admitting: Certified Registered"

## 2023-07-17 ENCOUNTER — Encounter (HOSPITAL_COMMUNITY): Payer: Self-pay | Admitting: Neurosurgery

## 2023-07-17 DIAGNOSIS — K219 Gastro-esophageal reflux disease without esophagitis: Secondary | ICD-10-CM | POA: Diagnosis present

## 2023-07-17 DIAGNOSIS — Z79899 Other long term (current) drug therapy: Secondary | ICD-10-CM | POA: Diagnosis not present

## 2023-07-17 DIAGNOSIS — T8484XA Pain due to internal orthopedic prosthetic devices, implants and grafts, initial encounter: Secondary | ICD-10-CM | POA: Diagnosis present

## 2023-07-17 DIAGNOSIS — F419 Anxiety disorder, unspecified: Secondary | ICD-10-CM | POA: Diagnosis present

## 2023-07-17 DIAGNOSIS — I739 Peripheral vascular disease, unspecified: Secondary | ICD-10-CM | POA: Diagnosis present

## 2023-07-17 DIAGNOSIS — I69344 Monoplegia of lower limb following cerebral infarction affecting left non-dominant side: Secondary | ICD-10-CM

## 2023-07-17 DIAGNOSIS — I639 Cerebral infarction, unspecified: Secondary | ICD-10-CM | POA: Diagnosis not present

## 2023-07-17 DIAGNOSIS — Z90710 Acquired absence of both cervix and uterus: Secondary | ICD-10-CM

## 2023-07-17 DIAGNOSIS — Y831 Surgical operation with implant of artificial internal device as the cause of abnormal reaction of the patient, or of later complication, without mention of misadventure at the time of the procedure: Secondary | ICD-10-CM | POA: Diagnosis present

## 2023-07-17 DIAGNOSIS — Z86711 Personal history of pulmonary embolism: Secondary | ICD-10-CM

## 2023-07-17 DIAGNOSIS — Z7901 Long term (current) use of anticoagulants: Secondary | ICD-10-CM

## 2023-07-17 DIAGNOSIS — I10 Essential (primary) hypertension: Secondary | ICD-10-CM | POA: Diagnosis present

## 2023-07-17 DIAGNOSIS — Z01818 Encounter for other preprocedural examination: Principal | ICD-10-CM

## 2023-07-17 DIAGNOSIS — Z79890 Hormone replacement therapy (postmenopausal): Secondary | ICD-10-CM | POA: Diagnosis not present

## 2023-07-17 DIAGNOSIS — Z885 Allergy status to narcotic agent status: Secondary | ICD-10-CM

## 2023-07-17 DIAGNOSIS — Z882 Allergy status to sulfonamides status: Secondary | ICD-10-CM | POA: Diagnosis not present

## 2023-07-17 DIAGNOSIS — M48061 Spinal stenosis, lumbar region without neurogenic claudication: Secondary | ICD-10-CM | POA: Diagnosis not present

## 2023-07-17 DIAGNOSIS — Z833 Family history of diabetes mellitus: Secondary | ICD-10-CM | POA: Diagnosis not present

## 2023-07-17 DIAGNOSIS — Z888 Allergy status to other drugs, medicaments and biological substances status: Secondary | ICD-10-CM | POA: Diagnosis not present

## 2023-07-17 DIAGNOSIS — Z880 Allergy status to penicillin: Secondary | ICD-10-CM

## 2023-07-17 DIAGNOSIS — Z91048 Other nonmedicinal substance allergy status: Secondary | ICD-10-CM | POA: Diagnosis not present

## 2023-07-17 DIAGNOSIS — S32009K Unspecified fracture of unspecified lumbar vertebra, subsequent encounter for fracture with nonunion: Secondary | ICD-10-CM | POA: Diagnosis present

## 2023-07-17 DIAGNOSIS — M960 Pseudarthrosis after fusion or arthrodesis: Secondary | ICD-10-CM

## 2023-07-17 DIAGNOSIS — F32A Depression, unspecified: Secondary | ICD-10-CM | POA: Diagnosis present

## 2023-07-17 HISTORY — PX: POSTERIOR FUSION PEDICLE SCREW PLACEMENT: SHX2186

## 2023-07-17 HISTORY — PX: POSTERIOR CERVICAL FUSION/FORAMINOTOMY: SHX5038

## 2023-07-17 LAB — PROTIME-INR
INR: 1.1 (ref 0.8–1.2)
Prothrombin Time: 14.4 s (ref 11.4–15.2)

## 2023-07-17 SURGERY — POSTERIOR LUMBAR FUSION 1 WITH HARDWARE REMOVAL
Anesthesia: General | Site: Back

## 2023-07-17 MED ORDER — MIDAZOLAM HCL 5 MG/5ML IJ SOLN
INTRAMUSCULAR | Status: DC | PRN
  Administered 2023-07-17: 2 mg via INTRAVENOUS

## 2023-07-17 MED ORDER — METHOCARBAMOL 500 MG PO TABS
500.0000 mg | ORAL_TABLET | Freq: Every day | ORAL | Status: DC | PRN
  Filled 2023-07-17 (×2): qty 1

## 2023-07-17 MED ORDER — CHLORHEXIDINE GLUCONATE CLOTH 2 % EX PADS
6.0000 | MEDICATED_PAD | Freq: Once | CUTANEOUS | Status: DC
Start: 2023-07-17 — End: 2023-07-17

## 2023-07-17 MED ORDER — ACETAMINOPHEN 10 MG/ML IV SOLN
INTRAVENOUS | Status: DC | PRN
  Administered 2023-07-17: 1000 mg via INTRAVENOUS

## 2023-07-17 MED ORDER — ROCURONIUM BROMIDE 10 MG/ML (PF) SYRINGE
PREFILLED_SYRINGE | INTRAVENOUS | Status: AC
  Filled 2023-07-17: qty 20

## 2023-07-17 MED ORDER — ACETAMINOPHEN 325 MG PO TABS: 650.0000 mg | ORAL_TABLET | Freq: Every evening | ORAL | Status: DC | PRN

## 2023-07-17 MED ORDER — ONDANSETRON HCL 4 MG/2ML IJ SOLN
INTRAMUSCULAR | Status: AC
Start: 2023-07-17 — End: ?
  Filled 2023-07-17: qty 2

## 2023-07-17 MED ORDER — PROPOFOL 10 MG/ML IV BOLUS
INTRAVENOUS | Status: DC | PRN
  Administered 2023-07-17: 50 mg via INTRAVENOUS
  Administered 2023-07-17: 150 mg via INTRAVENOUS

## 2023-07-17 MED ORDER — CEFAZOLIN SODIUM-DEXTROSE 2-4 GM/100ML-% IV SOLN
2.0000 g | Freq: Three times a day (TID) | INTRAVENOUS | Status: DC
  Administered 2023-07-17 – 2023-07-18 (×2): 2 g via INTRAVENOUS
  Filled 2023-07-17 (×3): qty 100

## 2023-07-17 MED ORDER — PHENOL 1.4 % MT LIQD: 1.0000 | OROMUCOSAL | Status: DC | PRN

## 2023-07-17 MED ORDER — SODIUM CHLORIDE 0.9 % IV SOLN
250.0000 mL | INTRAVENOUS | Status: DC
  Administered 2023-07-17: 250 mL via INTRAVENOUS

## 2023-07-17 MED ORDER — LIDOCAINE 2% (20 MG/ML) 5 ML SYRINGE
INTRAMUSCULAR | Status: AC
Start: 2023-07-17 — End: ?
  Filled 2023-07-17: qty 5

## 2023-07-17 MED ORDER — LIDOCAINE 2% (20 MG/ML) 5 ML SYRINGE
INTRAMUSCULAR | Status: DC | PRN
  Administered 2023-07-17: 60 mg via INTRAVENOUS

## 2023-07-17 MED ORDER — TRIAMCINOLONE ACETONIDE 0.1 % EX OINT: 1.0000 | TOPICAL_OINTMENT | Freq: Four times a day (QID) | CUTANEOUS | Status: DC | PRN

## 2023-07-17 MED ORDER — FENTANYL CITRATE (PF) 100 MCG/2ML IJ SOLN
25.0000 ug | INTRAMUSCULAR | Status: DC | PRN
  Administered 2023-07-17: 25 ug via INTRAVENOUS
  Administered 2023-07-17: 50 ug via INTRAVENOUS

## 2023-07-17 MED ORDER — HYDROCODONE-ACETAMINOPHEN 5-325 MG PO TABS
1.0000 | ORAL_TABLET | Freq: Every day | ORAL | Status: DC | PRN
  Administered 2023-07-17 – 2023-07-18 (×3): 1 via ORAL
  Filled 2023-07-17 (×4): qty 1

## 2023-07-17 MED ORDER — SUCCINYLCHOLINE CHLORIDE 200 MG/10ML IV SOSY
PREFILLED_SYRINGE | INTRAVENOUS | Status: DC | PRN
  Administered 2023-07-17: 120 mg via INTRAVENOUS

## 2023-07-17 MED ORDER — LIDOCAINE-EPINEPHRINE 1 %-1:100000 IJ SOLN
INTRAMUSCULAR | Status: AC
  Filled 2023-07-17: qty 1

## 2023-07-17 MED ORDER — THROMBIN 5000 UNITS EX SOLR
CUTANEOUS | Status: AC
  Filled 2023-07-17: qty 5000

## 2023-07-17 MED ORDER — ONDANSETRON HCL 4 MG PO TABS: 4.0000 mg | ORAL_TABLET | Freq: Four times a day (QID) | ORAL | Status: DC | PRN

## 2023-07-17 MED ORDER — ONDANSETRON HCL 4 MG/2ML IJ SOLN
INTRAMUSCULAR | Status: DC | PRN
  Administered 2023-07-17: 4 mg via INTRAVENOUS

## 2023-07-17 MED ORDER — ROCURONIUM BROMIDE 10 MG/ML (PF) SYRINGE
PREFILLED_SYRINGE | INTRAVENOUS | Status: DC | PRN
  Administered 2023-07-17 (×2): 50 mg via INTRAVENOUS
  Administered 2023-07-17: 20 mg via INTRAVENOUS

## 2023-07-17 MED ORDER — BACITRACIN ZINC 500 UNIT/GM EX OINT
TOPICAL_OINTMENT | CUTANEOUS | Status: AC
  Filled 2023-07-17: qty 28.35

## 2023-07-17 MED ORDER — LORATADINE 10 MG PO TABS
10.0000 mg | ORAL_TABLET | Freq: Every evening | ORAL | Status: DC
  Administered 2023-07-17: 10 mg via ORAL
  Filled 2023-07-17: qty 1

## 2023-07-17 MED ORDER — SUGAMMADEX SODIUM 500 MG/5ML IV SOLN
INTRAVENOUS | Status: DC | PRN
  Administered 2023-07-17: 200 mg via INTRAVENOUS

## 2023-07-17 MED ORDER — LIDOCAINE-EPINEPHRINE 1 %-1:100000 IJ SOLN
INTRAMUSCULAR | Status: DC | PRN
  Administered 2023-07-17 (×2): 10 mL

## 2023-07-17 MED ORDER — CARVEDILOL 12.5 MG PO TABS
12.5000 mg | ORAL_TABLET | Freq: Two times a day (BID) | ORAL | Status: DC
  Administered 2023-07-17: 12.5 mg via ORAL
  Filled 2023-07-17: qty 1

## 2023-07-17 MED ORDER — SODIUM CHLORIDE 0.9 % IV SOLN: INTRAVENOUS | Status: DC | PRN

## 2023-07-17 MED ORDER — FENTANYL CITRATE (PF) 100 MCG/2ML IJ SOLN
INTRAMUSCULAR | Status: AC
  Administered 2023-07-17: 25 ug via INTRAVENOUS
  Filled 2023-07-17: qty 2

## 2023-07-17 MED ORDER — ACETAMINOPHEN 325 MG PO TABS: 650.0000 mg | ORAL_TABLET | ORAL | Status: DC | PRN

## 2023-07-17 MED ORDER — CITALOPRAM HYDROBROMIDE 20 MG PO TABS
40.0000 mg | ORAL_TABLET | Freq: Every morning | ORAL | Status: DC
  Administered 2023-07-18: 40 mg via ORAL
  Filled 2023-07-17: qty 2

## 2023-07-17 MED ORDER — SENNOSIDES-DOCUSATE SODIUM 8.6-50 MG PO TABS
2.0000 | ORAL_TABLET | Freq: Every day | ORAL | Status: DC
  Administered 2023-07-17: 2 via ORAL
  Filled 2023-07-17: qty 2

## 2023-07-17 MED ORDER — AMISULPRIDE (ANTIEMETIC) 5 MG/2ML IV SOLN
INTRAVENOUS | Status: AC
  Administered 2023-07-17: 10 mg via INTRAVENOUS
  Filled 2023-07-17: qty 4

## 2023-07-17 MED ORDER — CHLORHEXIDINE GLUCONATE 0.12 % MT SOLN
OROMUCOSAL | Status: AC
  Administered 2023-07-17: 15 mL via OROMUCOSAL
  Filled 2023-07-17: qty 15

## 2023-07-17 MED ORDER — FENTANYL CITRATE (PF) 250 MCG/5ML IJ SOLN
INTRAMUSCULAR | Status: DC | PRN
  Administered 2023-07-17: 25 ug via INTRAVENOUS
  Administered 2023-07-17: 50 ug via INTRAVENOUS
  Administered 2023-07-17: 100 ug via INTRAVENOUS
  Administered 2023-07-17: 25 ug via INTRAVENOUS

## 2023-07-17 MED ORDER — LABETALOL HCL 5 MG/ML IV SOLN
INTRAVENOUS | Status: DC | PRN
  Administered 2023-07-17 (×2): 5 mg via INTRAVENOUS

## 2023-07-17 MED ORDER — AMISULPRIDE (ANTIEMETIC) 5 MG/2ML IV SOLN: 10.0000 mg | Freq: Once | INTRAVENOUS | Status: AC | PRN

## 2023-07-17 MED ORDER — ATORVASTATIN CALCIUM 10 MG PO TABS
20.0000 mg | ORAL_TABLET | Freq: Every day | ORAL | Status: DC
  Administered 2023-07-17: 20 mg via ORAL
  Filled 2023-07-17: qty 2

## 2023-07-17 MED ORDER — THROMBIN 20000 UNITS EX SOLR
CUTANEOUS | Status: AC
  Filled 2023-07-17: qty 20000

## 2023-07-17 MED ORDER — ORAL CARE MOUTH RINSE: 15.0000 mL | Freq: Once | OROMUCOSAL | Status: AC

## 2023-07-17 MED ORDER — BUPIVACAINE LIPOSOME 1.3 % IJ SUSP
INTRAMUSCULAR | Status: AC
  Filled 2023-07-17: qty 20

## 2023-07-17 MED ORDER — ALBUMIN HUMAN 5 % IV SOLN: INTRAVENOUS | Status: DC | PRN

## 2023-07-17 MED ORDER — 0.9 % SODIUM CHLORIDE (POUR BTL) OPTIME
TOPICAL | Status: DC | PRN
  Administered 2023-07-17 (×2): 1000 mL

## 2023-07-17 MED ORDER — ROCURONIUM BROMIDE 10 MG/ML (PF) SYRINGE
PREFILLED_SYRINGE | INTRAVENOUS | Status: AC
Start: 2023-07-17 — End: ?
  Filled 2023-07-17: qty 10

## 2023-07-17 MED ORDER — ALLOPURINOL 100 MG PO TABS
100.0000 mg | ORAL_TABLET | Freq: Every day | ORAL | Status: DC
  Administered 2023-07-17: 100 mg via ORAL
  Filled 2023-07-17: qty 1

## 2023-07-17 MED ORDER — EPHEDRINE 5 MG/ML INJ
INTRAVENOUS | Status: AC
  Filled 2023-07-17: qty 5

## 2023-07-17 MED ORDER — CHLORHEXIDINE GLUCONATE 0.12 % MT SOLN: 15.0000 mL | Freq: Once | OROMUCOSAL | Status: AC

## 2023-07-17 MED ORDER — THROMBIN 5000 UNITS EX SOLR
OROMUCOSAL | Status: DC | PRN
  Administered 2023-07-17: 5 mL via TOPICAL

## 2023-07-17 MED ORDER — SODIUM CHLORIDE 0.9% FLUSH: 3.0000 mL | Freq: Two times a day (BID) | INTRAVENOUS | Status: DC

## 2023-07-17 MED ORDER — THROMBIN 20000 UNITS EX SOLR
CUTANEOUS | Status: DC | PRN
  Administered 2023-07-17: 20 mL via TOPICAL

## 2023-07-17 MED ORDER — CEFAZOLIN SODIUM-DEXTROSE 2-4 GM/100ML-% IV SOLN
INTRAVENOUS | Status: AC
  Filled 2023-07-17: qty 100

## 2023-07-17 MED ORDER — CEFAZOLIN SODIUM-DEXTROSE 2-4 GM/100ML-% IV SOLN
2.0000 g | INTRAVENOUS | Status: AC
  Administered 2023-07-17: 2 g via INTRAVENOUS

## 2023-07-17 MED ORDER — BACITRACIN ZINC 500 UNIT/GM EX OINT
TOPICAL_OINTMENT | CUTANEOUS | Status: DC | PRN
  Administered 2023-07-17: 1 via TOPICAL

## 2023-07-17 MED ORDER — ALUM & MAG HYDROXIDE-SIMETH 200-200-20 MG/5ML PO SUSP: 30.0000 mL | Freq: Four times a day (QID) | ORAL | Status: DC | PRN

## 2023-07-17 MED ORDER — CARVEDILOL 12.5 MG PO TABS
12.5000 mg | ORAL_TABLET | Freq: Two times a day (BID) | ORAL | Status: DC
Start: 2023-07-18 — End: 2023-07-18
  Filled 2023-07-17: qty 1

## 2023-07-17 MED ORDER — MENTHOL 3 MG MT LOZG: 1.0000 | LOZENGE | OROMUCOSAL | Status: DC | PRN

## 2023-07-17 MED ORDER — MIDAZOLAM HCL 2 MG/2ML IJ SOLN
INTRAMUSCULAR | Status: AC
  Filled 2023-07-17: qty 2

## 2023-07-17 MED ORDER — MECLIZINE HCL 25 MG PO TABS: 25.0000 mg | ORAL_TABLET | Freq: Four times a day (QID) | ORAL | Status: DC | PRN

## 2023-07-17 MED ORDER — SODIUM CHLORIDE 0.9% FLUSH: 3.0000 mL | INTRAVENOUS | Status: DC | PRN

## 2023-07-17 MED ORDER — ONDANSETRON HCL 4 MG/2ML IJ SOLN: 4.0000 mg | Freq: Four times a day (QID) | INTRAMUSCULAR | Status: DC | PRN

## 2023-07-17 MED ORDER — LEVOCETIRIZINE DIHYDROCHLORIDE 5 MG PO TABS: 5.0000 mg | ORAL_TABLET | Freq: Every evening | ORAL | Status: DC

## 2023-07-17 MED ORDER — PROPOFOL 10 MG/ML IV BOLUS
INTRAVENOUS | Status: AC
  Filled 2023-07-17: qty 20

## 2023-07-17 MED ORDER — LACTATED RINGERS IV SOLN: INTRAVENOUS | Status: DC | PRN

## 2023-07-17 MED ORDER — PHENYLEPHRINE 80 MCG/ML (10ML) SYRINGE FOR IV PUSH (FOR BLOOD PRESSURE SUPPORT)
PREFILLED_SYRINGE | INTRAVENOUS | Status: AC
  Filled 2023-07-17: qty 10

## 2023-07-17 MED ORDER — FENTANYL CITRATE (PF) 250 MCG/5ML IJ SOLN
INTRAMUSCULAR | Status: AC
  Filled 2023-07-17: qty 5

## 2023-07-17 MED ORDER — METHOCARBAMOL 500 MG PO TABS
ORAL_TABLET | ORAL | Status: AC
  Administered 2023-07-17: 500 mg via ORAL
  Filled 2023-07-17: qty 1

## 2023-07-17 MED ORDER — DEXAMETHASONE SODIUM PHOSPHATE 10 MG/ML IJ SOLN
INTRAMUSCULAR | Status: DC | PRN
  Administered 2023-07-17: 10 mg via INTRAVENOUS

## 2023-07-17 MED ORDER — GLYCOPYRROLATE PF 0.2 MG/ML IJ SOSY
PREFILLED_SYRINGE | INTRAMUSCULAR | Status: DC | PRN
  Administered 2023-07-17 (×2): .1 mg via INTRAVENOUS

## 2023-07-17 MED ORDER — BUPIVACAINE LIPOSOME 1.3 % IJ SUSP
INTRAMUSCULAR | Status: DC | PRN
  Administered 2023-07-17: 20 mL

## 2023-07-17 MED ORDER — HYDROMORPHONE HCL 1 MG/ML IJ SOLN: 0.5000 mg | INTRAMUSCULAR | Status: DC | PRN

## 2023-07-17 MED ORDER — PHENYLEPHRINE HCL-NACL 20-0.9 MG/250ML-% IV SOLN
INTRAVENOUS | Status: DC | PRN
  Administered 2023-07-17: 25 ug/min via INTRAVENOUS

## 2023-07-17 MED ORDER — ESTRADIOL 0.5 MG PO TABS
0.5000 mg | ORAL_TABLET | Freq: Every day | ORAL | Status: DC
Start: 2023-07-17 — End: 2023-07-18
  Administered 2023-07-17: 0.5 mg via ORAL
  Filled 2023-07-17 (×2): qty 1

## 2023-07-17 MED ORDER — CHLORHEXIDINE GLUCONATE CLOTH 2 % EX PADS: 6.0000 | MEDICATED_PAD | Freq: Once | CUTANEOUS | Status: DC

## 2023-07-17 MED ORDER — ACETAMINOPHEN 650 MG RE SUPP: 650.0000 mg | RECTAL | Status: DC | PRN

## 2023-07-17 MED ORDER — DEXAMETHASONE SODIUM PHOSPHATE 10 MG/ML IJ SOLN
INTRAMUSCULAR | Status: AC
  Filled 2023-07-17: qty 1

## 2023-07-17 SURGICAL SUPPLY — 73 items
BAG COUNTER SPONGE SURGICOUNT (BAG) ×4 IMPLANT
BASKET BONE COLLECTION (BASKET) ×2 IMPLANT
BENZOIN TINCTURE PRP APPL 2/3 (GAUZE/BANDAGES/DRESSINGS) ×6 IMPLANT
BLADE BONE MILL MEDIUM (MISCELLANEOUS) ×2 IMPLANT
BLADE CLIPPER SURG (BLADE) ×2 IMPLANT
BLADE SURG 11 STRL SS (BLADE) ×4 IMPLANT
BONE VIVIGEN FORMABLE 10CC (Bone Implant) ×2 IMPLANT
BONE VIVIGEN FORMABLE 5.4CC (Bone Implant) ×2 IMPLANT
BUR CUTTER 7.0 ROUND (BURR) ×2 IMPLANT
BUR MATCHSTICK NEURO 3.0 LAGG (BURR) ×4 IMPLANT
CANISTER SUCT 3000ML PPV (MISCELLANEOUS) ×4 IMPLANT
CAP REVERE LOCKING (Cap) IMPLANT
CNTNR URN SCR LID CUP LEK RST (MISCELLANEOUS) ×2 IMPLANT
COVER BACK TABLE 60X90IN (DRAPES) ×2 IMPLANT
DERMABOND ADVANCED .7 DNX12 (GAUZE/BANDAGES/DRESSINGS) ×4 IMPLANT
DRAPE C-ARM 42X72 X-RAY (DRAPES) ×4 IMPLANT
DRAPE C-ARMOR (DRAPES) IMPLANT
DRAPE HALF SHEET 40X57 (DRAPES) IMPLANT
DRAPE LAPAROTOMY 100X72 PEDS (DRAPES) ×2 IMPLANT
DRAPE LAPAROTOMY 100X72X124 (DRAPES) ×2 IMPLANT
DRAPE MICROSCOPE SLANT 54X150 (MISCELLANEOUS) IMPLANT
DRAPE SURG 17X23 STRL (DRAPES) ×10 IMPLANT
DRSG OPSITE 4X5.5 SM (GAUZE/BANDAGES/DRESSINGS) ×2 IMPLANT
DRSG OPSITE POSTOP 4X6 (GAUZE/BANDAGES/DRESSINGS) ×2 IMPLANT
DRSG OPSITE POSTOP 4X8 (GAUZE/BANDAGES/DRESSINGS) IMPLANT
DURAPREP 26ML APPLICATOR (WOUND CARE) ×4 IMPLANT
ELECT REM PT RETURN 9FT ADLT (ELECTROSURGICAL) ×2 IMPLANT
ELECTRODE REM PT RTRN 9FT ADLT (ELECTROSURGICAL) ×4 IMPLANT
EVACUATOR 1/8 PVC DRAIN (DRAIN) IMPLANT
EVACUATOR 3/16 PVC DRAIN (DRAIN) ×2 IMPLANT
GAUZE 4X4 16PLY ~~LOC~~+RFID DBL (SPONGE) IMPLANT
GAUZE SPONGE 4X4 12PLY STRL (GAUZE/BANDAGES/DRESSINGS) ×4 IMPLANT
GLOVE BIO SURGEON STRL SZ7 (GLOVE) IMPLANT
GLOVE BIO SURGEON STRL SZ8 (GLOVE) ×6 IMPLANT
GLOVE BIOGEL PI IND STRL 7.0 (GLOVE) IMPLANT
GLOVE EXAM NITRILE XL STR (GLOVE) IMPLANT
GLOVE INDICATOR 8.5 STRL (GLOVE) ×12 IMPLANT
GOWN STRL REUS W/ TWL LRG LVL3 (GOWN DISPOSABLE) IMPLANT
GOWN STRL REUS W/ TWL XL LVL3 (GOWN DISPOSABLE) ×6 IMPLANT
GOWN STRL REUS W/TWL 2XL LVL3 (GOWN DISPOSABLE) IMPLANT
GRAFT BN 5X1XSPNE CVD POST DBM (Bone Implant) IMPLANT
GRAFT BNE MATRIX VG FRMBL L 10 (Bone Implant) IMPLANT
GRAFT BNE MATRIX VG FRMBL MD 5 (Bone Implant) IMPLANT
KIT BASIN OR (CUSTOM PROCEDURE TRAY) ×4 IMPLANT
KIT INFUSE SMALL (Orthopedic Implant) IMPLANT
KIT TURNOVER KIT B (KITS) ×4 IMPLANT
MARKER SKIN DUAL TIP RULER LAB (MISCELLANEOUS) ×2 IMPLANT
MILL BONE PREP (MISCELLANEOUS) ×2 IMPLANT
NDL HYPO 21X1.5 SAFETY (NEEDLE) ×2 IMPLANT
NDL HYPO 25X1 1.5 SAFETY (NEEDLE) ×4 IMPLANT
NDL SPNL 20GX3.5 QUINCKE YW (NEEDLE) ×2 IMPLANT
NEEDLE HYPO 21X1.5 SAFETY (NEEDLE) ×2 IMPLANT
NEEDLE HYPO 25X1 1.5 SAFETY (NEEDLE) ×2 IMPLANT
NEEDLE SPNL 20GX3.5 QUINCKE YW (NEEDLE) ×2 IMPLANT
NS IRRIG 1000ML POUR BTL (IV SOLUTION) ×4 IMPLANT
PACK LAMINECTOMY NEURO (CUSTOM PROCEDURE TRAY) ×4 IMPLANT
PAD ARMBOARD 7.5X6 YLW CONV (MISCELLANEOUS) ×12 IMPLANT
PIN MAYFIELD SKULL DISP (PIN) ×2 IMPLANT
ROD STRT TI 6.35X100 (Rod) IMPLANT
SCREW REVERE 5.5X50 (Screw) IMPLANT
SPIKE FLUID TRANSFER (MISCELLANEOUS) ×4 IMPLANT
SPONGE SURGIFOAM ABS GEL 100 (HEMOSTASIS) ×4 IMPLANT
SPONGE T-LAP 4X18 ~~LOC~~+RFID (SPONGE) IMPLANT
STRIP CLOSURE SKIN 1/2X4 (GAUZE/BANDAGES/DRESSINGS) ×6 IMPLANT
SUT ETHILON 4 0 PS 2 18 (SUTURE) IMPLANT
SUT VIC AB 0 CT1 18XCR BRD8 (SUTURE) ×6 IMPLANT
SUT VIC AB 2-0 CT1 18 (SUTURE) ×4 IMPLANT
SUT VIC AB 4-0 PS2 27 (SUTURE) ×4 IMPLANT
SYR 20ML LL LF (SYRINGE) IMPLANT
TOWEL GREEN STERILE (TOWEL DISPOSABLE) ×4 IMPLANT
TOWEL GREEN STERILE FF (TOWEL DISPOSABLE) ×4 IMPLANT
TRAY FOLEY MTR SLVR 16FR STAT (SET/KITS/TRAYS/PACK) ×2 IMPLANT
WATER STERILE IRR 1000ML POUR (IV SOLUTION) ×4 IMPLANT

## 2023-07-17 NOTE — Anesthesia Postprocedure Evaluation (Signed)
 Anesthesia Post Note  Patient: Sierra Leach  Procedure(s) Performed: Exploration and extension of fusion - Lumbar Three-Lumbar Four4 with new screws placed at Lumbar Four removal of hardware Lumbar One-Lumbar Three with redo Posterolateral arthrodesis Lumbar Three-Four (Back) Removal of hardware Cervical Seven-Thoracic One     Patient location during evaluation: PACU Anesthesia Type: General Level of consciousness: awake and alert Pain management: pain level controlled Vital Signs Assessment: post-procedure vital signs reviewed and stable Respiratory status: spontaneous breathing, nonlabored ventilation, respiratory function stable and patient connected to nasal cannula oxygen Cardiovascular status: blood pressure returned to baseline and stable Postop Assessment: no apparent nausea or vomiting Anesthetic complications: no  There were no known notable events for this encounter.  Last Vitals:  Vitals:   07/17/23 1400 07/17/23 1415  BP: (!) 127/93 (!) 123/98  Pulse: 81 81  Resp: 11 11  Temp: 36.8 C   SpO2: 93% 96%    Last Pain:  Vitals:   07/17/23 1400  PainSc: 6                  Epifanio Lamar BRAVO

## 2023-07-17 NOTE — Transfer of Care (Signed)
 Immediate Anesthesia Transfer of Care Note  Patient: Sierra Leach  Procedure(s) Performed: Exploration and extension of fusion - Lumbar Three-Lumbar Four4 with new screws placed at Lumbar Four removal of hardware Lumbar One-Lumbar Three with redo Posterolateral arthrodesis Lumbar Three-Four (Back) Removal of hardware Cervical Seven-Thoracic One  Patient Location: PACU  Anesthesia Type:General  Level of Consciousness: awake, alert , oriented, and patient cooperative  Airway & Oxygen Therapy: Patient Spontanous Breathing and Patient connected to face mask oxygen  Post-op Assessment: Report given to RN, Post -op Vital signs reviewed and stable, Patient moving all extremities, and Patient moving all extremities X 4  Post vital signs: Reviewed and stable  Last Vitals:  Vitals Value Taken Time  BP 143/92 07/17/23 1215  Temp    Pulse 74 07/17/23 1220  Resp 17 07/17/23 1220  SpO2 92 % 07/17/23 1220  Vitals shown include unfiled device data.  Last Pain:  Vitals:   07/17/23 0705  PainSc: 7       Patients Stated Pain Goal: 2 (07/17/23 0705)  Complications: There were no known notable events for this encounter.

## 2023-07-17 NOTE — Op Note (Signed)
 Diagnosis: #1 painful cervical hardware.  2.  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  Postoperative diagnosis: Same.  Procedure: #1 exploration fusion removal of hardware C7-T1 on the right.  2.  Through a separate skin incision expiration fusion move of hardware L1-L3 for pseudoarthrosis.  3.  Pedicle screw fixation with new pedicle screws placed at L4 connecting up to previous pedicle screws placed L1 L3 utilizing the globus Revere pedicle screw set with rotating locking caps.  4.  Posterolateral arthrodesis L3-4 utilizing Vivigen BMP and Magnifuse.  Surgeon: Arley helling.  Assistant: Suzen Click.  Anesthesia: General.  EBL: Minimal.  HPI: 73 year old female with longstanding chronic neck and back pain progressive worse over the last several months workup revealed malpositioned right C7 screw and loosening of the screws but what appear to be solid fusion at that level in addition to breaking through her previous fusion at L3-4 with pseudoarthrosis at that level.  Due to the patient's progression of clinical syndrome imaging findings of a conservative treatment I recommended exploring her cervical fusion removing of hardware as well as exploring her lumbar incision with replacement of L4 screws and redo posterolateral arthrodesis at that level.  I extensively reviewed the risks and benefits of the operation with her as well as perioperative course expectations of outcome and alternatives to surgery and she understood and agreed to proceed forward.  Operative procedure: Patient was brought into the OR was induced under general anesthesia positioned prone in pins the backside of her neck and the backside of her lumbar spine were all prepped and draped in routine sterile fashion.  First working at the cervical incision after infiltration of 10 cc lidocaine  with epi lateral incision was opened up the scar tissue was dissected free I exposed the right-sided hardware the fusion did appear to be solid I  disconnected and removed both screws did not see any evidence of pseudoarthrosis appear to be L1 solid fusion and no other complicating.  Pieces.  So I then closed the wound in layers with interrupted Vicryl and a running 4 subcuticular and Kimberly assisted in every part of that decompression removal and closure.  I then opened up her lumbar incision through a separate skin incision after infiltration of 10 cc lidocaine  epi and dissected the scar tissue away and exposed the hardware bilaterally from L1-L3.  We then remove the cross-link remove the knots remove the rods I exposed the facet joints and feel like identified the previous entry points under AP and lateral fluoroscopy recannulated with the all probe tapped with a 5 5 tap and placed six 5 x 50 screws inserted bilaterally with fluoroscopy confirming good position of the hardware.  I then aggressively decorticated the lateral facet joints and TPs from L3-L4 packed and extensive mount of the residual autograft BMP Vivigen and Magnifuse posterior laterally from L3-L4 then contoured to rods anchored everything in place and tightened everything down.  I then injected Exparel  in the fascia placed a medium Hemovac drain and closed the wound in layers with interrupted Vicryl and a running 4 subcuticular.  Dermabond benzoin Steri-Strips and a sterile dressing was applied patient recovery in stable condition.  At the end the case all needle counts and sponge counts were correct.

## 2023-07-17 NOTE — Progress Notes (Signed)
 Patient doing well, just got to the room and settled. Walked from the wheelchair to the bed fine. Pain controlled for now.

## 2023-07-17 NOTE — H&P (Signed)
 Sierra Leach is an 73 y.o. female.   Chief Complaint: Back and neck pain HPI: 73 year old female with neck and back pain workup has revealed malpositioned and loose cervical hardware as well as breakdown and pseudoarthrosis at L3-4.  So I recommended exploration of fusion of cervical spine at C7-T1 with removal of hardware as well as expiration fusion lumbar removal hardware L1-L3 replacement with new L4 screws and redo posterolateral arthrodesis at L3-4.  I have extensive gone over the risks and benefits of both procedures with her as well as perioperative course expectations of outcome and alternatives of surgery and she understands and agrees to proceed forward.  Past Medical History:  Diagnosis Date   Anxiety    Arthritis    Depression    GERD (gastroesophageal reflux disease)    occ   Headache    used to have migraines   History of kidney stones    Hypertension    Lumbar back pain    Peripheral vascular disease (HCC) 2013   pulmonary embolus   Pneumonia    PONV (postoperative nausea and vomiting)    Stroke (HCC) 99   left leg weakness    Past Surgical History:  Procedure Laterality Date   ABDOMINAL HYSTERECTOMY     ANTERIOR CERVICAL DECOMP/DISCECTOMY FUSION N/A 02/06/2016   Procedure: ANTERIOR CERVICAL DECOMPRESSION FUSION CERVICAL THREE-FOUR WITH REMOVAL OF PLATE CERVICAL FOUR-SEVEN  ;  Surgeon: Arley Helling, MD;  Location: [facility];  Service: Neurosurgery;  Laterality: N/A;   BACK SURGERY     x8   bone spurs     multiple   BREAST SURGERY Left    lumpectomy x3   COLONOSCOPY     LUMBAR WOUND DEBRIDEMENT N/A 09/04/2013   Procedure: irrigation and debridement of lumbar wound;  Surgeon: Deward JAYSON Fabian, MD;  Location: [facility];  Service: Neurosurgery;  Laterality: N/A;  irrigation and debridement of lumbar wound   POSTERIOR CERVICAL FUSION/FORAMINOTOMY Left 12/19/2016   Procedure: Posterior Cervical Fusion with lateral mass fixation - Cervical Seven-Thoracic one with  Diskectomy left;  Surgeon: Helling Arley, MD;  Location: [facility] OR;  Service: Neurosurgery;  Laterality: Left;   POSTERIOR CERVICAL FUSION/FORAMINOTOMY N/A 05/15/2017   Procedure: Posterior Cervical Fusion with lateral mass fixation - Cervical seven-Thoracic one revision;  Surgeon: Helling Arley, MD;  Location: [facility] OR;  Service: Neurosurgery;  Laterality: N/A;   POSTERIOR CERVICAL FUSION/FORAMINOTOMY N/A 06/19/2017   Procedure: Cervical seven-Thoracic one removal of hardware;  Surgeon: Helling Arley, MD;  Location: [facility] OR;  Service: Neurosurgery;  Laterality: N/A;   POSTERIOR CERVICAL FUSION/FORAMINOTOMY N/A 03/27/2021   Procedure: Posterior cervical fusion with lateral mass fixation - Cervical Seven-Thoracic One;  Surgeon: Helling Arley, MD;  Location: [facility] OR;  Service: Neurosurgery;  Laterality: N/A;   SHOULDER ARTHROSCOPY Left 03   rotatoer cuff   SHOULDER ARTHROSCOPY Right 99   bone spur    Family History  Problem Relation Age of Onset   High arches Mother    Neuropathy Mother    COPD Father    High blood pressure Sister    Diabetes Brother    Parkinson's disease Brother    Social History:  reports that she has never smoked. She has never used smokeless tobacco. She reports that she does not drink alcohol and does not use drugs.  Allergies:  Allergies  Allergen Reactions   Penicillins Hives, Itching, Rash and Other (See Comments)    Has patient had a PCN reaction causing immediate rash, facial/tongue/throat swelling,  SOB or lightheadedness with hypotension: Yes Has patient had a PCN reaction causing severe rash involving mucus membranes or skin necrosis: No Has patient had a PCN reaction that required hospitalization: No Has patient had a PCN reaction occurring within the last 10 years: No If all of the above answers are NO, then may proceed with Cephalosporin use.    Sulfa Antibiotics Other (See Comments)    Breaks mouth out in blisters   Talwin [Pentazocine] Other (See Comments)     Hallucinations, out of body experience back in her early 88's   Hydrocodone -Acetaminophen  Other (See Comments)    Cannot take 10/325    Adhesive [Tape] Rash    Medications Prior to Admission  Medication Sig Dispense Refill   acetaminophen  (TYLENOL ) 500 MG tablet Take 2,000 mg by mouth at bedtime as needed for mild pain (pain score 1-3) or headache.     allopurinol  (ZYLOPRIM ) 100 MG tablet Take 100 mg by mouth at bedtime.     atorvastatin  (LIPITOR) 20 MG tablet Take 20 mg by mouth at bedtime.     carvedilol  (COREG ) 12.5 MG tablet Take 12.5 mg by mouth 2 (two) times daily with a meal.     citalopram  (CELEXA ) 40 MG tablet Take 40 mg by mouth in the morning.     estradiol  (ESTRACE ) 0.5 MG tablet Take 0.5 mg by mouth at bedtime.     famotidine (PEPCID) 10 MG tablet Take 10 mg by mouth daily as needed for heartburn or indigestion.     HYDROcodone -acetaminophen  (NORCO/VICODIN) 5-325 MG tablet Take 1 tablet by mouth daily as needed for moderate pain (pain score 4-6) or severe pain (pain score 7-10).     levocetirizine (XYZAL ) 5 MG tablet Take 1 tablet by mouth every evening.     meclizine  (ANTIVERT ) 25 MG tablet Take 25 mg by mouth every 6 (six) hours as needed for dizziness.     methocarbamol  (ROBAXIN ) 500 MG tablet Take 1 tablet (500 mg total) by mouth 4 (four) times daily. (Patient taking differently: Take 500 mg by mouth daily as needed for muscle spasms.) 45 tablet 0   sennosides-docusate sodium  (SENOKOT-S) 8.6-50 MG tablet Take 2 tablets by mouth at bedtime.     triamcinolone  ointment (KENALOG ) 0.1 % Apply 1 application topically 4 (four) times daily as needed (eczema).     warfarin (COUMADIN ) 3 MG tablet Take 3 mg by mouth at bedtime.      Results for orders placed or performed during the hospital encounter of 07/17/23 (from the past 48 hours)  Protime-INR     Status: None   Collection Time: 07/17/23  7:07 AM  Result Value Ref Range   Prothrombin Time 14.4 11.4 - 15.2 seconds   INR 1.1  0.8 - 1.2    Comment: (NOTE) INR goal varies based on device and disease states. Performed at [facility], [address], [place], [place] [address]    No results found.  Review of Systems  Musculoskeletal:  Positive for back pain and neck pain.  Neurological:  Positive for numbness.    Blood pressure (!) 158/110, pulse 77, temperature 97.9 F (36.6 C), resp. rate 16, height 5' 4 (1.626 m), weight 108.5 kg, SpO2 97%. Physical Exam HENT:     Head: Normocephalic.     Right Ear: Tympanic membrane normal.     Nose: Nose normal.     Mouth/Throat:     Mouth: Mucous membranes are moist.  Eyes:     Pupils: Pupils  are equal, round, and reactive to light.  Cardiovascular:     Rate and Rhythm: Normal rate.     Pulses: Normal pulses.  Pulmonary:     Effort: Pulmonary effort is normal.  Abdominal:     General: Abdomen is flat.  Musculoskeletal:        General: Normal range of motion.     Cervical back: Normal range of motion.  Skin:    General: Skin is warm.  Neurological:     Mental Status: She is alert.     Comments: Strength is 5 out of 5 deltoid, bicep, tricep, wrist flexion, wrist extension, hand intrinsics.      Assessment/Plan 62-year-old female presents for removal of hardware C7-T1 and redo posterolateral arthrodesis L3-4  Arley SHAUNNA Helling, MD 07/17/2023, 8:24 AM

## 2023-07-17 NOTE — Anesthesia Procedure Notes (Addendum)
 Procedure Name: Intubation Date/Time: 07/17/2023 8:46 AM  Performed by: Salvatin, Melissa, RNPre-anesthesia Checklist: Patient identified, Emergency Drugs available, Suction available and Patient being monitored Patient Re-evaluated:Patient Re-evaluated prior to induction Oxygen Delivery Method: Circle system utilized Preoxygenation: Pre-oxygenation with 100% oxygen Induction Type: IV induction Ventilation: Mask ventilation without difficulty Laryngoscope Size: Glidescope and 3 Grade View: Grade I Tube type: Oral Number of attempts: 1 Airway Equipment and Method: Stylet and Oral airway Placement Confirmation: ETT inserted through vocal cords under direct vision, positive ETCO2 and breath sounds checked- equal and bilateral Secured at: 22 (22cm at the lip) cm Tube secured with: Tape Dental Injury: Teeth and Oropharynx as per pre-operative assessment  Difficulty Due To: Difficult Airway- due to reduced neck mobility Comments: Head and neck maintained in position of comfort for induction/intubation. Surgeon placed pins and positioned prone post intubation

## 2023-07-18 ENCOUNTER — Other Ambulatory Visit (HOSPITAL_COMMUNITY): Payer: Self-pay

## 2023-07-18 MED ORDER — OXYCODONE-ACETAMINOPHEN 5-325 MG PO TABS
1.0000 | ORAL_TABLET | ORAL | 0 refills | Status: AC | PRN
  Filled 2023-07-18: qty 20, 3d supply, fill #0

## 2023-07-18 MED ORDER — HYDROCODONE-ACETAMINOPHEN 5-325 MG PO TABS
1.0000 | ORAL_TABLET | Freq: Once | ORAL | Status: AC
  Administered 2023-07-18: 1 via ORAL

## 2023-07-18 MED ORDER — METHOCARBAMOL 500 MG PO TABS
500.0000 mg | ORAL_TABLET | Freq: Four times a day (QID) | ORAL | 0 refills | Status: AC
  Filled 2023-07-18: qty 45, 11d supply, fill #0

## 2023-07-18 NOTE — Evaluation (Signed)
 Physical Therapy Evaluation  Patient Details Name: Sierra Leach MRN: [id number] DOB: 1951/03/09 Today's Date: 07/18/2023  History of Present Illness  Pt is a 73 y/o female who presents s/p Posterior cervical fusion and lumbar fusion on 07/17/2023. PMH significant for HTN, PVD, CVA, ACDF 2017, x8 back surgeries, lumbar wound debridement, x4 posterior cervical fusion.   Clinical Impression  Pt admitted with above diagnosis. At the time of PT eval, pt was able to demonstrate transfers and ambulation with gross CGA and RW for support. Pt was educated on precautions, brace application/wearing schedule, appropriate activity progression, and car transfer. Pt currently with functional limitations due to the deficits listed below (see PT Problem List). Pt will benefit from skilled PT to increase their independence and safety with mobility to allow discharge to the venue listed below.          If plan is discharge home, recommend the following: A little help with walking and/or transfers;A little help with bathing/dressing/bathroom;Assistance with cooking/housework;Assist for transportation;Help with stairs or ramp for entrance   Can travel by private vehicle        Equipment Recommendations None recommended by PT  Recommendations for Other Services       Functional Status Assessment Patient has had a recent decline in their functional status and demonstrates the ability to make significant improvements in function in a reasonable and predictable amount of time.     Precautions / Restrictions Precautions Precautions: Fall;Back;Cervical Precaution Booklet Issued: Yes (comment) Precaution Comments: Reviewed handout and pt was cued for precautions during functional mobility. Required Braces or Orthoses: Spinal Brace Spinal Brace: Lumbar corset;Applied in sitting position Restrictions Weight Bearing Restrictions Per Provider Order: No      Mobility  Bed Mobility Overal bed mobility: Needs  Assistance Bed Mobility: Rolling, Sidelying to Sit Rolling: Contact guard assist Sidelying to sit: Contact guard assist       General bed mobility comments: Slow and very effortful. Increased time and step by step cues for log roll technique. Pt exited L side of bed, with HOB flat and rails lowered to simulate home environment.    Transfers Overall transfer level: Needs assistance Equipment used: None, Rolling walker (2 wheels) Transfers: Sit to/from Stand Sit to Stand: Contact guard assist           General transfer comment: Pt demonstrated proper hand placement on seated surface for safety.    Ambulation/Gait Ambulation/Gait assistance: Contact guard assist Gait Distance (Feet): 300 Feet Assistive device: Rolling walker (2 wheels) Gait Pattern/deviations: Step-through pattern, Decreased stride length, Trunk flexed Gait velocity: Decreased Gait velocity interpretation: <1.8 ft/sec, indicate of risk for recurrent falls   General Gait Details: VC's for improved posture, closer walker proximity and forward gaze. No assist required.  Stairs Stairs: Yes Stairs assistance: Min assist Stair Management: One rail Right, Step to pattern, Forwards Number of Stairs: 4 General stair comments: HHA on the L to simulate home environment. Pt unable to reach both rails in stairway to practice today but will be able to reach both rails at home.  Wheelchair Mobility     Tilt Bed    Modified Rankin (Stroke Patients Only)       Balance Overall balance assessment: Needs assistance Sitting-balance support: Feet supported, No upper extremity supported Sitting balance-Leahy Scale: Fair     Standing balance support: No upper extremity supported, During functional activity Standing balance-Leahy Scale: Poor Standing balance comment: Reliant on RW for longer distance ambulation. Reaching out for furniture when attempting to  mobilize around the room without RW.                              Pertinent Vitals/Pain Pain Assessment Pain Assessment: Faces Faces Pain Scale: Hurts even more Pain Location: Incision sites Pain Descriptors / Indicators: Operative site guarding, Sore Pain Intervention(s): Limited activity within patient's tolerance, Monitored during session, Repositioned    Home Living Family/patient expects to be discharged to:: Private residence Living Arrangements: Alone Available Help at Discharge: Family;Available PRN/intermittently Type of Home: Mobile home Home Access: Stairs to enter Entrance Stairs-Rails: Right;Left;Can reach both Entrance Stairs-Number of Steps: 4   Home Layout: One level Home Equipment: Agricultural Consultant (2 wheels);Rollator (4 wheels);BSC/3in1;Shower seat;Grab bars - toilet;Grab bars - tub/shower      Prior Function Prior Level of Function : Independent/Modified Independent             Mobility Comments: Using a 3 wheeled walker without a seat.       Extremity/Trunk Assessment   Upper Extremity Assessment Upper Extremity Assessment: Defer to OT evaluation    Lower Extremity Assessment Lower Extremity Assessment: Generalized weakness    Cervical / Trunk Assessment Cervical / Trunk Assessment: Back Surgery;Neck Surgery  Communication   Communication Communication: No apparent difficulties Cueing Techniques: Verbal cues;Gestural cues  Cognition Arousal: Alert Behavior During Therapy: WFL for tasks assessed/performed Overall Cognitive Status: Within Functional Limits for tasks assessed                                          General Comments      Exercises     Assessment/Plan    PT Assessment Patient needs continued PT services  PT Problem List Decreased strength;Decreased activity tolerance;Decreased balance;Decreased mobility;Decreased knowledge of use of DME;Decreased safety awareness;Decreased knowledge of precautions;Pain       PT Treatment Interventions DME  instruction;Gait training;Functional mobility training;Stair training;Therapeutic activities;Therapeutic exercise;Balance training;Patient/family education    PT Goals (Current goals can be found in the Care Plan section)  Acute Rehab PT Goals Patient Stated Goal: Home today PT Goal Formulation: With patient Time For Goal Achievement: 08/01/23 Potential to Achieve Goals: Good    Frequency Min 1X/week     Co-evaluation               AM-PAC PT 6 Clicks Mobility  Outcome Measure Help needed turning from your back to your side while in a flat bed without using bedrails?: A Little Help needed moving from lying on your back to sitting on the side of a flat bed without using bedrails?: A Little Help needed moving to and from a bed to a chair (including a wheelchair)?: A Little Help needed standing up from a chair using your arms (e.g., wheelchair or bedside chair)?: A Little Help needed to walk in hospital room?: A Little Help needed climbing 3-5 steps with a railing? : A Little 6 Click Score: 18    End of Session Equipment Utilized During Treatment: Gait belt;Back brace Activity Tolerance: Patient tolerated treatment well Patient left: in bed;with call bell/phone within reach Nurse Communication: Mobility status PT Visit Diagnosis: Unsteadiness on feet (R26.81);Pain Pain - part of body:  (back, neck)    Time: 9150-9077 PT Time Calculation (min) (ACUTE ONLY): 33 min   Charges:   PT Evaluation $PT Eval Low Complexity: 1 Low PT Treatments $Gait Training:  8-22 mins PT General Charges $$ ACUTE PT VISIT: 1 Visit         Leita Sable, PT, DPT Acute Rehabilitation Services Secure Chat Preferred Office: [phone]   Leita JONETTA Sable 07/18/2023, 9:49 AM

## 2023-07-18 NOTE — TOC Transition Note (Signed)
 Transition of Care [facility]) - Discharge Note   Patient Details  Name: Sierra Leach MRN: [id number] Date of Birth: Nov 02, 1950  [facility] CM/SW Contact:  Andrez JULIANNA George, RN Phone Number: 07/18/2023, 10:43 AM   Clinical Narrative:     Pt is discharging home with home health services through [facility]. Information on the AVS. [facility] will contact her for the first home visit. Pt has transportation home and has someone to check on her at home.  Final next level of care: Home w Home Health Services Barriers to Discharge: No Barriers Identified   Patient Goals and CMS Choice   CMS Medicare.gov Compare Post Acute Care list provided to:: Patient Choice offered to / list presented to : Patient      Discharge Placement                       Discharge Plan and Services Additional resources added to the After Visit Summary for                            [facility] Arranged: PT, Nurse's Aide [facility] Agency: [facility] Date [facility] Agency Contacted: 07/18/23   Representative spoke with at [facility] Agency: Darleene  Social Drivers of Health (SDOH) Interventions SDOH Screenings   Food Insecurity: No Food Insecurity (07/17/2023)  Housing: Unknown (07/17/2023)  Transportation Needs: No Transportation Needs (07/17/2023)  Utilities: Not At Risk (07/17/2023)  Financial Resource Strain: Low Risk  (04/24/2021)   Received from [facility] visits prior to 09/08/2022.  Physical Activity: Inactive (04/24/2021)   Received from [facility] visits prior to 09/08/2022.  Social Connections: Unknown (07/17/2023)  Stress: No Stress Concern Present (04/24/2021)   Received from [facility] visits prior to 09/08/2022.  Tobacco Use: Low Risk  (07/17/2023)     Readmission Risk Interventions     No data to display

## 2023-07-18 NOTE — Plan of Care (Signed)

## 2023-07-18 NOTE — Progress Notes (Signed)
 Foley Dc'ed Pt is voiding, passing gas, and walking independently. Pt states she is able to turn her head w/o becoming dizzy. Pain is well controlled with PO meds.

## 2023-07-18 NOTE — Plan of Care (Signed)
 Pt doing well. Pt given D/C instructions with verbal understanding. Rx's were delivered to the room prior to D/C. Pt's incision is clean and dry with no sign of infection. Pt's IV and Hemovac were removed prior to D/C. Pt's Home Health was arranged per MD order. Pt D/C'd home via wheelchair per MD order. Pt is stable @ D/C and has no other needs at this time. Rosina Rakers, RN

## 2023-07-18 NOTE — Evaluation (Signed)
 Occupational Therapy Evaluation Patient Details Name: Sierra Leach MRN: [id number] DOB: 06-19-1951 Today's Date: 07/18/2023   History of Present Illness Pt is a 73 y/o female who presents s/p Posterior cervical fusion and lumbar fusion on 07/17/2023. PMH significant for HTN, PVD, CVA, ACDF 2017, x8 back surgeries, lumbar wound debridement, x4 posterior cervical fusion.   Clinical Impression   Patient admitted for the diagnosis above.  PTA she lives alone at home and remained active and independent.  She does have a daughter that lives nearby, who can assist on a PRN level.  Currently she is very close to baseline, and no further OT needs exist in the acute setting.  Patient has had multiple back procedures, and is familiar with all precautions and has the needed DME at home to remain Mod I.  No post acute OT is needed.         If plan is discharge home, recommend the following: Assist for transportation    Functional Status Assessment  Patient has had a recent decline in their functional status and demonstrates the ability to make significant improvements in function in a reasonable and predictable amount of time.  Equipment Recommendations  None recommended by OT    Recommendations for Other Services       Precautions / Restrictions Precautions Precautions: Fall;Back;Cervical Precaution Booklet Issued: Yes (comment) Precaution Comments: Reviewed handout and pt was cued for precautions during functional mobility. Required Braces or Orthoses: Spinal Brace Spinal Brace: Lumbar corset;Applied in sitting position Restrictions Weight Bearing Restrictions Per Provider Order: No      Mobility Bed Mobility               General bed mobility comments: sitting EOB Patient Response: Cooperative  Transfers Overall transfer level: Needs assistance Equipment used: Rolling walker (2 wheels) Transfers: Sit to/from Stand Sit to Stand: Supervision                  Balance  Overall balance assessment: Needs assistance Sitting-balance support: Feet supported, No upper extremity supported Sitting balance-Leahy Scale: Good     Standing balance support: Reliant on assistive device for balance Standing balance-Leahy Scale: Fair                             ADL either performed or assessed with clinical judgement   ADL                                         General ADL Comments: Generalized supervision, no assist.     Vision Patient Visual Report: No change from baseline       Perception Perception: Not tested       Praxis Praxis: Not tested       Pertinent Vitals/Pain Pain Assessment Pain Assessment: Faces Faces Pain Scale: Hurts little more Pain Location: Incision sites Pain Descriptors / Indicators: Operative site guarding, Sore     Extremity/Trunk Assessment Upper Extremity Assessment Upper Extremity Assessment: Overall WFL for tasks assessed   Lower Extremity Assessment Lower Extremity Assessment: Defer to PT evaluation   Cervical / Trunk Assessment Cervical / Trunk Assessment: Back Surgery;Neck Surgery   Communication Communication Communication: No apparent difficulties Cueing Techniques: Verbal cues;Gestural cues   Cognition Arousal: Alert Behavior During Therapy: WFL for tasks assessed/performed Overall Cognitive Status: Within Functional Limits for tasks assessed  General Comments   VSS on RA    Exercises     Shoulder Instructions      Home Living Family/patient expects to be discharged to:: Private residence Living Arrangements: Alone Available Help at Discharge: Family;Available PRN/intermittently Type of Home: Mobile home Home Access: Stairs to enter Entrance Stairs-Number of Steps: 4 Entrance Stairs-Rails: Right;Left;Can reach both Home Layout: One level     Bathroom Shower/Tub: Chief Strategy Officer:  Handicapped height Bathroom Accessibility: Yes How Accessible: Accessible via walker Home Equipment: Rolling Walker (2 wheels);Rollator (4 wheels);BSC/3in1;Shower seat;Grab bars - toilet;Grab bars - tub/shower;Adaptive equipment Adaptive Equipment: Reacher        Prior Functioning/Environment Prior Level of Function : Independent/Modified Independent             Mobility Comments: Using a 3 wheeled walker without a seat. ADLs Comments: Continues to complete ADL, iADL and mobility without assist        OT Problem List: Pain      OT Treatment/Interventions:      OT Goals(Current goals can be found in the care plan section) Acute Rehab OT Goals Patient Stated Goal: Return home with [facility] OT Goal Formulation: With patient Time For Goal Achievement: 07/22/23 Potential to Achieve Goals: Good  OT Frequency:      Co-evaluation              AM-PAC OT 6 Clicks Daily Activity     Outcome Measure Help from another person eating meals?: None Help from another person taking care of personal grooming?: None Help from another person toileting, which includes using toliet, bedpan, or urinal?: A Little Help from another person bathing (including washing, rinsing, drying)?: A Little Help from another person to put on and taking off regular upper body clothing?: None Help from another person to put on and taking off regular lower body clothing?: A Little 6 Click Score: 21   End of Session Equipment Utilized During Treatment: Rolling walker (2 wheels) Nurse Communication: Mobility status  Activity Tolerance: Patient tolerated treatment well Patient left: in chair;with call bell/phone within reach  OT Visit Diagnosis: Unsteadiness on feet (R26.81)                Time: 9074-9049 OT Time Calculation (min): 25 min Charges:  OT General Charges $OT Visit: 1 Visit OT Evaluation $OT Eval Moderate Complexity: 1 Mod OT Treatments $Self Care/Home Management : 8-22  mins  07/18/2023  RP, OTR/L  Acute Rehabilitation Services  Office:  [phone]   Sierra Leach 07/18/2023, 9:57 AM

## 2023-07-18 NOTE — Progress Notes (Signed)
 Subjective: Patient reports doing well, not much pain   Objective: Vital signs in last 24 hours: Temp:  [97.8 F (36.6 C)-98.6 F (37 C)] 98.1 F (36.7 C) (01/09 0405) Pulse Rate:  [74-81] 76 (01/09 0405) Resp:  [11-20] 20 (01/09 0405) BP: (120-134)/(75-98) 127/76 (01/09 0405) SpO2:  [92 %-97 %] 97 % (01/09 0405)  Intake/Output from previous day: 01/08 0701 - 01/09 0700 In: 2854.8 [P.O.:600; I.V.:1554.8; IV Piggyback:700] Out: 1807 [Urine:1202; Drains:355; Blood:250] Intake/Output this shift: No intake/output data recorded.  Neurologic: Grossly normal  Lab Results: Lab Results  Component Value Date   WBC 6.1 07/08/2023   HGB 13.5 07/08/2023   HCT 41.6 07/08/2023   MCV 93.7 07/08/2023   PLT 221 07/08/2023   Lab Results  Component Value Date   INR 1.1 07/17/2023   BMET Lab Results  Component Value Date   NA 136 07/08/2023   K 3.8 07/08/2023   CL 101 07/08/2023   CO2 25 07/08/2023   GLUCOSE 119 (H) 07/08/2023   BUN 15 07/08/2023   CREATININE 0.96 07/08/2023   CALCIUM  9.0 07/08/2023    Studies/Results: DG Lumbar Spine 2-3 Views Result Date: 07/17/2023 CLINICAL DATA:  Elective surgery EXAM: LUMBAR SPINE - 2-3 VIEW COMPARISON:  Lumbar spine x-ray 05/09/2023 FINDINGS: Two intraoperative fluoroscopic views of the lumbar spine. Posterior lumbar fusion hardware is present at multiple levels with disc spacers. Fluoroscopy time 47 seconds. Fluoroscopy dose 56.64 micro gray. IMPRESSION: Intraoperative fluoroscopic views of the lumbar spine. Electronically Signed   By: Greig Pique M.D.   On: 07/17/2023 15:11   DG C-Arm 1-60 Min-No Report Result Date: 07/17/2023 Fluoroscopy was utilized by the requesting physician.  No radiographic interpretation.    Assessment/Plan: Postop day 2 posterior cervical hardware removal and redo posterior lateral lumbar fusion. She lives at home and would like to get help at home or either go to a SNF. Will get social work to help with this.  Work with therapy today   LOS: 1 day    Sierra Leach 07/18/2023, 7:58 AM

## 2023-07-18 NOTE — Discharge Summary (Signed)
 Physician Discharge Summary  Patient ID: MAYBEL DAMBROSIO MRN: [id number] DOB/AGE: August 10, 1950 73 y.o.  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   painful cervical hardware    Discharge Diagnoses: same   Discharged Condition: good  Hospital Course: The patient was admitted on 07/17/2023 and taken to the operating room where the patient underwent redo posterior lateral fusion and addition of L4 screws, removal of hardware right C7-T1. The patient tolerated the procedure well and was taken to the recovery room and then to the floor in stable condition. The hospital course was routine. There were no complications. The wound remained clean dry and intact. Pt had appropriate neck and back soreness. No complaints of arm or leg pain or new N/T/W. The patient remained afebrile with stable vital signs, and tolerated a regular diet. The patient continued to increase activities, and pain was well controlled with oral pain medications.   Consults: None  Significant Diagnostic Studies:  Results for orders placed or performed during the hospital encounter of 07/17/23  Protime-INR   Collection Time: 07/17/23  7:07 AM  Result Value Ref Range   Prothrombin Time 14.4 11.4 - 15.2 seconds   INR 1.1 0.8 - 1.2    DG Lumbar Spine 2-3 Views Result Date: 07/17/2023 CLINICAL DATA:  Elective surgery EXAM: LUMBAR SPINE - 2-3 VIEW COMPARISON:  Lumbar spine x-ray 05/09/2023 FINDINGS: Two intraoperative fluoroscopic views of the lumbar spine. Posterior lumbar fusion hardware is present at multiple levels with disc spacers. Fluoroscopy time 47 seconds. Fluoroscopy dose 56.64 micro gray. IMPRESSION: Intraoperative fluoroscopic views of the lumbar spine. Electronically Signed   By: Greig Pique M.D.   On: 07/17/2023 15:11   DG C-Arm 1-60 Min-No Report Result Date: 07/17/2023 Fluoroscopy was utilized by the requesting physician.  No radiographic interpretation.     Antibiotics:  Anti-infectives (From admission, onward)    Start     Dose/Rate Route Frequency Ordered Stop   07/17/23 1700  ceFAZolin  (ANCEF ) IVPB 2g/100 mL premix        2 g 200 mL/hr over 30 Minutes Intravenous Every 8 hours 07/17/23 1428 07/19/23 1659   07/17/23 0715  ceFAZolin  (ANCEF ) IVPB 2g/100 mL premix        2 g 200 mL/hr over 30 Minutes Intravenous On call to O.R. 07/17/23 0651 07/17/23 0900   07/17/23 0657  ceFAZolin  (ANCEF ) 2-4 GM/100ML-% IVPB       Note to Pharmacy: Joli Search N: cabinet override      07/17/23 0657 07/17/23 0929       Discharge Exam: Blood pressure 111/70, pulse 81, temperature 98.7 F (37.1 C), temperature source Oral, resp. rate 18, height 5' 4 (1.626 m), weight 108.5 kg, SpO2 98%. Neurologic: Grossly normal Ambulating and voiding well incision cdi   Discharge Medications:   Allergies as of 07/18/2023       Reactions   Penicillins Hives, Itching, Rash, Other (See Comments)   Has patient had a PCN reaction causing immediate rash, facial/tongue/throat swelling, SOB or lightheadedness with hypotension: Yes Has patient had a PCN reaction causing severe rash involving mucus membranes or skin necrosis: No Has patient had a PCN reaction that required hospitalization: No Has patient had a PCN reaction occurring within the last 10 years: No If all of the above answers are NO, then may proceed with Cephalosporin use.   Sulfa Antibiotics Other (See Comments)   Breaks mouth out in blisters   Talwin [pentazocine] Other (See Comments)   Hallucinations, out  of body experience back in her early 20's   Hydrocodone -acetaminophen  Other (See Comments)   Cannot take 10/325    Adhesive [tape] Rash        Medication List     STOP taking these medications    HYDROcodone -acetaminophen  5-325 MG tablet Commonly known as: NORCO/VICODIN       TAKE these medications    acetaminophen  500 MG tablet Commonly known as: TYLENOL  Take 2,000 mg by  mouth at bedtime as needed for mild pain (pain score 1-3) or headache.   allopurinol  100 MG tablet Commonly known as: ZYLOPRIM  Take 100 mg by mouth at bedtime.   atorvastatin  20 MG tablet Commonly known as: LIPITOR Take 20 mg by mouth at bedtime.   carvedilol  12.5 MG tablet Commonly known as: COREG  Take 12.5 mg by mouth 2 (two) times daily with a meal.   citalopram  40 MG tablet Commonly known as: CELEXA  Take 40 mg by mouth in the morning.   estradiol  0.5 MG tablet Commonly known as: ESTRACE  Take 0.5 mg by mouth at bedtime.   famotidine 10 MG tablet Commonly known as: PEPCID Take 10 mg by mouth daily as needed for heartburn or indigestion.   levocetirizine 5 MG tablet Commonly known as: XYZAL  Take 1 tablet by mouth every evening.   meclizine  25 MG tablet Commonly known as: ANTIVERT  Take 25 mg by mouth every 6 (six) hours as needed for dizziness.   methocarbamol  500 MG tablet Commonly known as: Robaxin  Take 1 tablet (500 mg total) by mouth 4 (four) times daily. What changed:  when to take this reasons to take this   oxyCODONE -acetaminophen  5-325 MG tablet Commonly known as: Percocet Take 1 tablet by mouth every 4 (four) hours as needed for severe pain (pain score 7-10).   sennosides-docusate sodium  8.6-50 MG tablet Commonly known as: SENOKOT-S Take 2 tablets by mouth at bedtime.   triamcinolone  ointment 0.1 % Commonly known as: KENALOG  Apply 1 application topically 4 (four) times daily as needed (eczema).   warfarin 3 MG tablet Commonly known as: COUMADIN  Take 3 mg by mouth at bedtime.        Disposition: home   Final Dx:  redo posterior lateral fusion and addition of L4 screws, removal of hardware right C7-T1.  Discharge Instructions      Remove dressing in 72 hours   Complete by: As directed    Call MD for:  difficulty breathing, headache or visual disturbances   Complete by: As directed    Call MD for:  extreme fatigue   Complete by: As  directed    Call MD for:  hives   Complete by: As directed    Call MD for:  persistant dizziness or light-headedness   Complete by: As directed    Call MD for:  persistant nausea and vomiting   Complete by: As directed    Call MD for:  redness, tenderness, or signs of infection (pain, swelling, redness, odor or green/yellow discharge around incision site)   Complete by: As directed    Call MD for:  severe uncontrolled pain   Complete by: As directed    Call MD for:  temperature >100.4   Complete by: As directed    Diet - low sodium heart healthy   Complete by: As directed    Driving Restrictions   Complete by: As directed    No driving for 2 weeks, no riding in the car for 1 week   Increase activity slowly   Complete  by: As directed    Lifting restrictions   Complete by: As directed    No lifting more than 8 lbs        Follow-up Information     [facility] Follow up.   Specialty: Home Health Services Why: the home health agency will contact you for the first home visit Contact information: [address] [place] [address] [phone]                  Signed: Suzen Lacks [facility] 07/18/2023, 12:18 PM

## 2023-07-19 ENCOUNTER — Encounter (HOSPITAL_COMMUNITY): Payer: Self-pay | Admitting: Neurosurgery

## 2023-10-24 ENCOUNTER — Other Ambulatory Visit (HOSPITAL_COMMUNITY): Payer: Self-pay

## 2023-10-24 ENCOUNTER — Other Ambulatory Visit: Payer: Self-pay

## 2024-07-24 ENCOUNTER — Other Ambulatory Visit: Payer: Self-pay | Admitting: Neurosurgery

## 2024-07-24 DIAGNOSIS — M542 Cervicalgia: Secondary | ICD-10-CM
